# Patient Record
Sex: Male | Born: 2000 | Race: Black or African American | Hispanic: No | Marital: Single | Smoking: Current some day smoker
Health system: Southern US, Community
[De-identification: ages and names within clinical notes are randomized; demographics above are authoritative.]

## PROBLEM LIST (undated history)

## (undated) DIAGNOSIS — L309 Dermatitis, unspecified: Secondary | ICD-10-CM

## (undated) DIAGNOSIS — T7840XA Allergy, unspecified, initial encounter: Secondary | ICD-10-CM

## (undated) DIAGNOSIS — J302 Other seasonal allergic rhinitis: Secondary | ICD-10-CM

## (undated) HISTORY — DX: Dermatitis, unspecified: L30.9

## (undated) HISTORY — DX: Allergy, unspecified, initial encounter: T78.40XA

---
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
## 2018-06-03 MED ORDER — PROPOFOL 10 MG/ML IV BOLUS
INTRAVENOUS | Status: AC
  Filled 2018-06-03: qty 20

## 2018-06-03 MED ORDER — MIDAZOLAM HCL 2 MG/2ML IJ SOLN
INTRAMUSCULAR | Status: AC
  Filled 2018-06-03: qty 2

## 2018-06-03 MED ORDER — LIDOCAINE 2% (20 MG/ML) 5 ML SYRINGE
INTRAMUSCULAR | Status: AC
  Filled 2018-06-03: qty 5

## 2018-06-03 MED ORDER — ONDANSETRON HCL 4 MG/2ML IJ SOLN
INTRAMUSCULAR | Status: AC
Start: 2018-06-03 — End: ?
  Filled 2018-06-03: qty 2

## 2018-06-03 MED ORDER — DEXAMETHASONE SODIUM PHOSPHATE 10 MG/ML IJ SOLN
INTRAMUSCULAR | Status: AC
  Filled 2018-06-03: qty 1

## 2018-06-03 MED ORDER — SUCCINYLCHOLINE CHLORIDE 200 MG/10ML IV SOSY
PREFILLED_SYRINGE | INTRAVENOUS | Status: AC
  Filled 2018-06-03: qty 10

## 2018-06-03 MED ORDER — FENTANYL CITRATE (PF) 250 MCG/5ML IJ SOLN
INTRAMUSCULAR | Status: AC
  Filled 2018-06-03: qty 5

## 2018-06-04 ENCOUNTER — Emergency Department (HOSPITAL_BASED_OUTPATIENT_CLINIC_OR_DEPARTMENT_OTHER)
Admission: EM | Admit: 2018-06-04 | Discharge: 2018-06-04 | Disposition: A | Discharge status: Home / Self Care | Payer: 59 | Attending: Urology | Admitting: Urology

## 2018-06-04 ENCOUNTER — Emergency Department (HOSPITAL_COMMUNITY): Payer: 59 | Admitting: Registered Nurse

## 2018-06-04 ENCOUNTER — Encounter (HOSPITAL_BASED_OUTPATIENT_CLINIC_OR_DEPARTMENT_OTHER): Payer: Self-pay | Admitting: Emergency Medicine

## 2018-06-04 ENCOUNTER — Encounter (HOSPITAL_COMMUNITY)
Admission: EM | Disposition: A | Discharge status: Home / Self Care | Payer: Self-pay | Source: Home / Self Care | Attending: Emergency Medicine

## 2018-06-04 ENCOUNTER — Other Ambulatory Visit: Payer: Self-pay

## 2018-06-04 ENCOUNTER — Emergency Department (HOSPITAL_COMMUNITY): Payer: 59

## 2018-06-04 DIAGNOSIS — Z91010 Allergy to peanuts: Secondary | ICD-10-CM | POA: Insufficient documentation

## 2018-06-04 DIAGNOSIS — J45909 Unspecified asthma, uncomplicated: Secondary | ICD-10-CM | POA: Insufficient documentation

## 2018-06-04 DIAGNOSIS — Z7951 Long term (current) use of inhaled steroids: Secondary | ICD-10-CM | POA: Insufficient documentation

## 2018-06-04 DIAGNOSIS — Z886 Allergy status to analgesic agent status: Secondary | ICD-10-CM | POA: Insufficient documentation

## 2018-06-04 DIAGNOSIS — N4400 Torsion of testis, unspecified: Secondary | ICD-10-CM | POA: Insufficient documentation

## 2018-06-04 DIAGNOSIS — N50811 Right testicular pain: Secondary | ICD-10-CM | POA: Diagnosis present

## 2018-06-04 DIAGNOSIS — Z91018 Allergy to other foods: Secondary | ICD-10-CM | POA: Diagnosis not present

## 2018-06-04 DIAGNOSIS — Z91012 Allergy to eggs: Secondary | ICD-10-CM | POA: Diagnosis not present

## 2018-06-04 DIAGNOSIS — Z79899 Other long term (current) drug therapy: Secondary | ICD-10-CM | POA: Diagnosis not present

## 2018-06-04 HISTORY — PX: ORCHIECTOMY: SHX2116

## 2018-06-04 SURGERY — ORCHIECTOMY
Anesthesia: General | Site: Scrotum | Laterality: Right

## 2018-06-04 MED ORDER — ONDANSETRON HCL 4 MG/2ML IJ SOLN: 4.0000 mg | Freq: Once | INTRAMUSCULAR | Status: DC | PRN

## 2018-06-04 MED ORDER — HYDROCODONE-ACETAMINOPHEN 5-325 MG PO TABS: 1.0000 | ORAL_TABLET | ORAL | 0 refills | Status: DC | PRN

## 2018-06-04 MED ORDER — LACTATED RINGERS IV SOLN
INTRAVENOUS | Status: DC | PRN
  Administered 2018-06-04: 02:00:00 via INTRAVENOUS

## 2018-06-04 MED ORDER — HYDROMORPHONE HCL 1 MG/ML IJ SOLN
1.0000 mg | Freq: Once | INTRAMUSCULAR | Status: AC
  Administered 2018-06-04: 1 mg via INTRAVENOUS
  Filled 2018-06-04: qty 1

## 2018-06-04 MED ORDER — CEFAZOLIN SODIUM-DEXTROSE 2-3 GM-%(50ML) IV SOLR
INTRAVENOUS | Status: DC | PRN
  Administered 2018-06-04: 2 g via INTRAVENOUS

## 2018-06-04 MED ORDER — BUPIVACAINE HCL (PF) 0.25 % IJ SOLN
INTRAMUSCULAR | Status: DC | PRN
  Administered 2018-06-04: 10 mL

## 2018-06-04 MED ORDER — DEXAMETHASONE SODIUM PHOSPHATE 10 MG/ML IJ SOLN
INTRAMUSCULAR | Status: DC | PRN
  Administered 2018-06-04: 10 mg via INTRAVENOUS

## 2018-06-04 MED ORDER — CEFAZOLIN SODIUM-DEXTROSE 2-4 GM/100ML-% IV SOLN
INTRAVENOUS | Status: AC
  Filled 2018-06-04: qty 100

## 2018-06-04 MED ORDER — MORPHINE SULFATE (PF) 4 MG/ML IV SOLN
4.0000 mg | Freq: Once | INTRAVENOUS | Status: AC
  Administered 2018-06-04: 4 mg via INTRAVENOUS
  Filled 2018-06-04: qty 1

## 2018-06-04 MED ORDER — ONDANSETRON HCL 4 MG/2ML IJ SOLN
INTRAMUSCULAR | Status: DC | PRN
  Administered 2018-06-04: 4 mg via INTRAVENOUS

## 2018-06-04 MED ORDER — FENTANYL CITRATE (PF) 250 MCG/5ML IJ SOLN
INTRAMUSCULAR | Status: DC | PRN
  Administered 2018-06-04 (×2): 50 ug via INTRAVENOUS
  Administered 2018-06-04: 100 ug via INTRAVENOUS

## 2018-06-04 MED ORDER — PROPOFOL 10 MG/ML IV BOLUS
INTRAVENOUS | Status: DC | PRN
  Administered 2018-06-04: 200 mg via INTRAVENOUS

## 2018-06-04 MED ORDER — MIDAZOLAM HCL 5 MG/5ML IJ SOLN
INTRAMUSCULAR | Status: DC | PRN
  Administered 2018-06-04: 2 mg via INTRAVENOUS

## 2018-06-04 MED ORDER — 0.9 % SODIUM CHLORIDE (POUR BTL) OPTIME
TOPICAL | Status: DC | PRN
  Administered 2018-06-04: 1000 mL

## 2018-06-04 MED ORDER — FENTANYL CITRATE (PF) 100 MCG/2ML IJ SOLN: 25.0000 ug | INTRAMUSCULAR | Status: DC | PRN

## 2018-06-04 MED ORDER — ONDANSETRON HCL 4 MG/2ML IJ SOLN
4.0000 mg | Freq: Once | INTRAMUSCULAR | Status: AC
  Administered 2018-06-04: 4 mg via INTRAVENOUS
  Filled 2018-06-04: qty 2

## 2018-06-04 MED ORDER — BUPIVACAINE HCL (PF) 0.25 % IJ SOLN
INTRAMUSCULAR | Status: AC
  Filled 2018-06-04: qty 30

## 2018-06-04 MED ORDER — SUCCINYLCHOLINE CHLORIDE 200 MG/10ML IV SOSY
PREFILLED_SYRINGE | INTRAVENOUS | Status: DC | PRN
  Administered 2018-06-04: 120 mg via INTRAVENOUS

## 2018-06-04 MED ORDER — LIDOCAINE 2% (20 MG/ML) 5 ML SYRINGE
INTRAMUSCULAR | Status: DC | PRN
  Administered 2018-06-04: 80 mg via INTRAVENOUS

## 2018-06-04 SURGICAL SUPPLY — 25 items
BLADE CLIPPER SURG (BLADE) ×3 IMPLANT
BLADE HEX COATED 2.75 (ELECTRODE) IMPLANT
BNDG GAUZE ELAST 4 BULKY (GAUZE/BANDAGES/DRESSINGS) IMPLANT
COVER SURGICAL LIGHT HANDLE (MISCELLANEOUS) ×3 IMPLANT
DERMABOND ADVANCED (GAUZE/BANDAGES/DRESSINGS) ×4
DERMABOND ADVANCED .7 DNX12 (GAUZE/BANDAGES/DRESSINGS) ×2 IMPLANT
DRAIN PENROSE 18X1/4 LTX STRL (WOUND CARE) IMPLANT
DRAPE LAPAROTOMY T 98X78 PEDS (DRAPES) ×3 IMPLANT
ELECT REM PT RETURN 15FT ADLT (MISCELLANEOUS) ×3 IMPLANT
GLOVE BIO SURGEON STRL SZ7.5 (GLOVE) ×3 IMPLANT
GOWN STRL REUS W/TWL LRG LVL3 (GOWN DISPOSABLE) ×3 IMPLANT
KIT BASIN OR (CUSTOM PROCEDURE TRAY) ×3 IMPLANT
NEEDLE HYPO 22GX1.5 SAFETY (NEEDLE) ×3 IMPLANT
NS IRRIG 1000ML POUR BTL (IV SOLUTION) ×3 IMPLANT
PACK GENERAL/GYN (CUSTOM PROCEDURE TRAY) ×3 IMPLANT
SOL PREP PROV IODINE SCRUB 4OZ (MISCELLANEOUS) ×3 IMPLANT
SUPPORT SCROTAL LG STRP (MISCELLANEOUS) ×2 IMPLANT
SUPPORTER ATHLETIC LG (MISCELLANEOUS) ×1
SUT CHROMIC 3 0 SH 27 (SUTURE) ×6 IMPLANT
SUT PDS AB 4-0 RB1 27 (SUTURE) ×3 IMPLANT
SUT VIC AB 2-0 UR5 27 (SUTURE) IMPLANT
SUT VICRYL 0 TIES 12 18 (SUTURE) IMPLANT
SYR CONTROL 10ML LL (SYRINGE) IMPLANT
TOWEL OR 17X26 10 PK STRL BLUE (TOWEL DISPOSABLE) ×3 IMPLANT
WATER STERILE IRR 1000ML POUR (IV SOLUTION) ×3 IMPLANT

## 2018-06-04 NOTE — H&P (Signed)
H&P Physician requesting consult: Jerry LibraJohn Molpus, MD  Chief Complaint: Right testicular torsion  History of Present Illness: 17 year old male presents with a 2 to 3-hour history of acute onset right testicular pain.  [facility]  Bedside ultrasound revealed confirmation of right testicular torsion with no flow to the right testicle.  Continues to have some right-sided testicular pain.  Otherwise, no complaints.  No hematuria dysuria.  Past Medical History:  Diagnosis Date  . Allergy   . Asthma   . Eczema   . Seasonal allergies    History reviewed. No pertinent surgical history.  Home Medications:   (Not in a hospital admission) Allergies:  Allergies  Allergen Reactions  . Aspirin Shortness Of Breath    Feels like his chest "closes up"   . Corn-Containing Products   . Eggs Or Egg-Derived Products   . Peanut-Containing Drug Products     Family History  Problem Relation Age of Onset  . Kidney disease Maternal Grandfather 23       kidney failure  . Cancer Paternal Grandmother [id number]       b reast cancer  . Heart disease Neg Hx   . Stroke Neg Hx    Social History:  reports that he has never smoked. He has never used smokeless tobacco. He reports that he does not drink alcohol or use drugs.  ROS: A complete review of systems was performed.  All systems are negative except for pertinent findings as noted. ROS   Physical Exam:  Vital signs in last 24 hours: Temp:  [98.5 F (36.9 C)] 98.5 F (36.9 C) (08/28 0030) Pulse Rate:  [68] 68 (08/28 0030) Resp:  [18] 18 (08/28 0030) BP: (149)/(71) 149/71 (08/28 0030) SpO2:  [99 %] 99 % (08/28 0201) Weight:  [95.3 kg] 95.3 kg (08/28 0032) General:  Alert and oriented, No acute distress HEENT: Normocephalic, atraumatic Neck: No JVD or lymphadenopathy Cardiovascular: Regular rate and rhythm Lungs: Regular rate and effort Abdomen: Soft, nontender, nondistended, no abdominal masses Genitourinary:  Circumcised phallus.  Left testicle normal.  Right testicle high riding and firm.  Tenderness to palpation. Back: No CVA tenderness Extremities: No edema Neurologic: Grossly intact  Laboratory Data:  No results found for this or any previous visit (from the past 24 hour(s)). No results found for this or any previous visit (from the past 240 hour(s)). Creatinine: No results for input(s): CREATININE in the last 168 hours.  Imaging: Scrotal ultrasound personally reviewed.  No obvious flow to the right testicle.  Left testicle appears normal.  Impression/Assessment:  Right testicular torsion  Plan:  Proceed emergently to the operating room for bilateral orchiopexy, possible right orchiectomy.  Full informed consent was obtained.  Risks including but not limited to bleeding including hematoma formation, infection, injury to surrounding structures, need for additional procedures were described in detail.  Jerry Diaz, III 06/04/2018, 2:13 AM

## 2018-06-04 NOTE — ED Provider Notes (Signed)
The patient was seen at [facility] after the acute onset of right testicular pain about 10:30 PM yesterday evening.  Out of concern for acute torsion he was sent here for an emergent scrotal ultrasound.  Dr. Alvester MorinBell of urology was consulted as well.  The patient states he is still in significant pain.'  On exam the patient's right testicle is withdrawn and firm with significant tenderness.  He is circumcised with no urethral discharge seen.  The patient's left testicle is normal.  1:48 AM Scrotal ultrasound shows no flow to right testicle.  Dr. Alvester MorinBell of urology consulted and will take the patient to the operating room.  [place] Scrotum W/doppler  Result Date: 06/04/2018 CLINICAL DATA:  17 year old male with right-sided testicular pain x2 hours. EXAM: SCROTAL ULTRASOUND DOPPLER ULTRASOUND OF THE TESTICLES TECHNIQUE: Complete ultrasound examination of the testicles, epididymis, and other scrotal structures was performed. Color and spectral Doppler ultrasound were also utilized to evaluate blood flow to the testicles. COMPARISON:  None. FINDINGS: Right testicle Measurements: 4.9 x 3.2 x 2.8 cm. The right testicle is mildly rounded and hypoechoic in keeping with edema. No focal testicular lesion. Left testicle Measurements: 4.6 x 2.1 x 2.9 cm. No mass or microlithiasis visualized. Right epididymis:  Normal in size and appearance. Left epididymis:  Normal in size and appearance. Hydrocele:  Trace right sided fluid, likely reactive. Varicocele:  None visualized. Pulsed Doppler interrogation of both testes demonstrates absence of flow to the right testicle with normal low resistance arterial and venous waveforms to the left testicle. 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 Clinical correlation and surgical consult is advised. These results were called by telephone at the time of interpretation on 06/04/2018 at 1:53 am to Dr. Read DriversMolpus, who verbally acknowledged these results.  Electronically Signed   By: Elgie CollardArash  Radparvar M.D.   On: 06/04/2018 01:54      Charm Stenner, Jonny RuizJohn, MD 06/04/18 [phone]

## 2018-06-04 NOTE — ED Triage Notes (Signed)
Pt c/o 10/10 testicle pain and swollen for the past 2 hours. Pt denies any injury.

## 2018-06-04 NOTE — ED Notes (Signed)
Bedside ultrasound-Dr. Molpus called to bedside/Dr. Alvester MorinBell urology paged and patient being prepared for OR

## 2018-06-04 NOTE — Op Note (Signed)
Operative Note  Preoperative diagnosis:  1.  Right testicular torsion  Postoperative diagnosis: 1.  Right testicular torsion  Procedure(s): 1.  Scrotal exploration with bilateral orchiopexy 2.  Bilateral ablation of appendix testis  Surgeon: Modena SlaterEugene Bell, MD  Assistants: None  Anesthesia: General  Complications: None immediate  EBL: 10 cc  Specimens: 1.  None  Drains/Catheters: 1.  None  Intraoperative findings: Right testicular torsion  Indication: 17 year old male presented with a 2 to 3-hour history of right-sided severe testicular pain.  Ultrasound confirmed torsion.  Description of procedure:  The patient was identified and consent was obtained.  The patient was taken to the operating room and placed in the supine position.  The patient was placed under general anesthesia.  Perioperative antibiotics were administered. Patient was prepped and draped in a standard sterile fashion and a timeout was performed.  A 3 cm midline scrotal incision was made along the median raphae.  This was carried down sharply through the dark toes on top of the right testicle which was then delivered onto the operative field.  The testicle was obviously torsed and I detorsed it.  I wrapped it in a wet Ray-Tec.  I then turned my attention to the other side and sharply came through the dark toes on the left and delivered the left testicle.  Bilateral appendix testes were ablated with electrocautery.  The left testicle is normal.  I then used 4-0 PDS suture to perform a two-point fixation superiorly and inferiorly and secured the left testicle to the midline portion of the dartos.  I delivered the testicle in its proper position in the scrotum and secured down the stitches.  I then inspected the right testicle.  This had pinked up quite nicely.  It appeared viable.  I therefore again on this side performed a two-point fixation superiorly and inferiorly with 4-0 PDS suture.  I secured this to the midline  dartos and delivered the testicle in its proper anatomical position back into the scrotum followed by securing down the stitches.  I then reapproximated the dartos after confirming hemostasis.  I reapproximated it with running 3-0 chromic.  I then closed the skin with running 3-0 chromic.  I applied Dermabond.  Also instilled quarter percent Marcaine for anesthetic effect.  This could the operation.  The patient tolerated procedure well and was stable postoperatively.  Plan: Return in about 4 weeks for postoperative check.

## 2018-06-04 NOTE — ED Notes (Signed)
Patient to OR via stretcher.

## 2018-06-04 NOTE — Anesthesia Procedure Notes (Addendum)
Procedure Name: Intubation Date/Time: 06/04/2018 2:37 AM Performed by: Talbot Grumbling, CRNA Pre-anesthesia Checklist: Patient identified, Emergency Drugs available, Suction available and Patient being monitored Patient Re-evaluated:Patient Re-evaluated prior to induction Oxygen Delivery Method: Circle system utilized Preoxygenation: Pre-oxygenation with 100% oxygen Induction Type: IV induction and Rapid sequence Laryngoscope Size: Mac and 4 Grade View: Grade I Tube size: 7.5 mm Number of attempts: 1 Airway Equipment and Method: Stylet Placement Confirmation: ETT inserted through vocal cords under direct vision,  positive ETCO2 and breath sounds checked- equal and bilateral Secured at: 23 cm Tube secured with: Tape Dental Injury: Teeth and Oropharynx as per pre-operative assessment

## 2018-06-04 NOTE — ED Provider Notes (Signed)
[facility] EMERGENCY DEPARTMENT Provider Note   CSN: [id number] Arrival date & time: 06/04/18  0022     History   Chief Complaint Chief Complaint  Patient presents with  . Testicle Pain    HPI Jerry Diaz is a 17 y.o. male.  HPI  This is an 17 year old male who presents with right testicle pain.  Acute onset of right testicle pain 1 prior hour prior to arrival.  Reports that he was at work prior to that with no symptoms.  Never had any symptoms like this before.  Denies any penile discharge or dysuria.  Rates his pain at 10 out of 10.  He has not taken anything for the pain.  He feels that his testicle is swollen and hard.  Reports nausea without vomiting.  Past Medical History:  Diagnosis Date  . Allergy   . Asthma   . Eczema   . Seasonal allergies     Patient Active Problem List   Diagnosis Date Noted  . Boil 11/14/2016  . 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 EGG ALLERGY 02/02/2009  . ECZEMA, ATOPIC DERMATITIS 12/05/2006    History reviewed. No pertinent surgical history.      Home Medications    Prior to Admission medications   Medication Sig Start Date End Date Taking? Authorizing Provider  albuterol (PROVENTIL HFA;VENTOLIN HFA) 108 (90 Base) MCG/ACT inhaler Inhale 2 puffs into the lungs every 6 (six) hours as needed for wheezing or shortness of breath. 01/29/17   Hollice GongSawyer, Tarshree, MD  beclomethasone (QVAR) 40 MCG/ACT inhaler Inhale 2 puffs into the lungs 2 (two) times daily. 06/10/13   Hayden RasmussenMabe, David, NP  cetirizine (ZYRTEC ALLERGY) 10 MG tablet Take 1 tablet (10 mg total) by mouth daily. 01/29/17   Hollice GongSawyer, Tarshree, MD  EPINEPHrine (EPIPEN) 0.3 mg/0.3 mL DEVI Inject 0.3 mLs (0.3 mg total) into the muscle once. 06/01/11   Macy MisBriscoe, Kim K, MD  fexofenadine (ALLEGRA) 180 MG tablet Take 1 tablet (180 mg total) by mouth daily. 06/10/13   Hayden RasmussenMabe, David, NP  fluticasone (FLONASE) 50 MCG/ACT nasal spray Place 2 sprays into the nose daily. 06/10/13   Hayden RasmussenMabe, David, NP    fluticasone (FLOVENT HFA) 44 MCG/ACT inhaler Inhale 2 puffs into the lungs 2 (two) times daily. 01/29/17   Hollice GongSawyer, Tarshree, MD  ipratropium (ATROVENT) 0.06 % nasal spray Place 1 spray into the nose 4 (four) times daily. 01/12/13   Linna HoffKindl, James D, MD  hydrOXYzine (ATARAX) 10 MG tablet One at bedtime and as needed, not more than 3 per day   11/07/11  [provider]  tacrolimus (PROTOPIC) 0.03 % ointment 1 (Rx by Dr. Willa RoughHicks)   11/07/11  [provider]    Family History Family History  Problem Relation Age of Onset  . Kidney disease Maternal Grandfather 23       kidney failure  . Cancer Paternal Grandmother 4048       b reast cancer  . Heart disease Neg Hx   . Stroke Neg Hx     Social History Social History   Tobacco Use  . Smoking status: Never Smoker  . Smokeless tobacco: Never Used  Substance Use Topics  . Alcohol use: No  . Drug use: No     Allergies   Aspirin; Corn-containing products; Eggs or egg-derived products; and Peanut-containing drug products   Review of Systems Review of Systems  Constitutional: Negative for fever.  Gastrointestinal: Positive for nausea. Negative for vomiting.  Genitourinary: Positive for scrotal swelling  and testicular pain. Negative for discharge and dysuria.  All other systems reviewed and are negative.    Physical Exam Updated Vital Signs BP (!) 149/71 (BP Location: Right Arm)   Pulse 68   Temp 98.5 F (36.9 C) (Oral)   Resp 18   Ht 6' (1.829 m)   Wt 95.3 kg   SpO2 99%   BMI 28.49 kg/m   Physical Exam  Constitutional: He is oriented to person, place, and time. He appears well-developed and well-nourished.  Obese, very uncomfortable appearing  HENT:  Head: Normocephalic and atraumatic.  Cardiovascular: Normal rate, regular rhythm and normal heart sounds.  No murmur heard. Pulmonary/Chest: Effort normal and breath sounds normal. No respiratory distress.  Abdominal: Soft. Bowel sounds are normal. There is no  tenderness. There is no rebound.  Genitourinary:  Genitourinary Comments: High riding right testicle with tenderness to palpation, testicle hard and edematous, no crepitus, no overlying skin changes, absent cremasteric reflex  Musculoskeletal: He exhibits no edema.  Neurological: He is alert and oriented to person, place, and time.  Skin: Skin is warm and dry.  Psychiatric: He has a normal mood and affect.  Nursing note and vitals reviewed.    ED Treatments / Results  Labs (all labs ordered are listed, but only abnormal results are displayed) Labs Reviewed - No data to display  EKG None  Radiology No results found.  Procedures Procedures (including critical care time)  Medications Ordered in ED Medications  morphine 4 MG/ML injection 4 mg (4 mg Intravenous Given 06/04/18 0045)  ondansetron (ZOFRAN) injection 4 mg (4 mg Intravenous Given 06/04/18 0045)     Initial Impression / Assessment and Plan / ED Course  I have reviewed the triage vital signs and the nursing notes.  Pertinent labs & imaging results that were available during my care of the patient were reviewed by me and considered in my medical decision making (see chart for details).     Patient presents with acute onset of right testicle pain.  He appears very uncomfortable although his vital signs are reassuring.  No significant medical problems.  Clinically have a very high suspicion for torsion.  Patient was given pain and nausea medication.  Was unable to detorse manually.  Patient has been in pain for approximately 90 minutes at this point.  Discussed with my colleague, Dr. Read Drivers at the ED.  We will plan for an ED to ED transfer by private vehicle (aunt driving) for ultrasound and urology evaluation.  I did discuss the patient with Dr. Alvester Morin, urology.  He is aware of his acute presentation and will be informed of his arrival was along.  Ultrasound is ordered.    Final Clinical Impressions(s) / ED Diagnoses    Final diagnoses:  Pain in right testicle    ED Discharge Orders    None       Jaynia Fendley, Mayer Masker, MD 06/04/18 [phone]

## 2018-06-04 NOTE — Transfer of Care (Signed)
Immediate Anesthesia Transfer of Care Note  Patient: Jerry Diaz  Procedure(s) Performed: bilateral orciopexy,scrotal exploration (Right Scrotum)  Patient Location: PACU  Anesthesia Type:General  Level of Consciousness: sedated  Airway & Oxygen Therapy: Patient Spontanous Breathing and Patient connected to face mask oxygen  Post-op Assessment: Report given to RN and Post -op Vital signs reviewed and stable  Post vital signs: Reviewed and stable  Last Vitals:  Vitals Value Taken Time  BP 131/60 06/04/2018  3:20 AM  Temp 37.1 C 06/04/2018  3:20 AM  Pulse 88 06/04/2018  3:29 AM  Resp 18 06/04/2018  3:29 AM  SpO2 100 % 06/04/2018  3:29 AM  Vitals shown include unvalidated device data.  Last Pain:  Vitals:   06/04/18 0320  TempSrc:   PainSc: Asleep         Complications: No apparent anesthesia complications

## 2018-06-04 NOTE — Anesthesia Preprocedure Evaluation (Signed)
Anesthesia Evaluation  Patient identified by MRN, date of birth, ID band  Reviewed: Allergy & Precautions, NPO status , Patient's Chart, lab work & pertinent test resultsPreop documentation limited or incomplete due to emergent nature of procedure.  Airway Mallampati: II  TM Distance: >3 FB Neck ROM: Full    Dental  (+) Dental Advisory Given   Pulmonary asthma ,    breath sounds clear to auscultation       Cardiovascular negative cardio ROS   Rhythm:Regular Rate:Normal     Neuro/Psych negative neurological ROS     GI/Hepatic negative GI ROS, Neg liver ROS,   Endo/Other    Renal/GU negative Renal ROS   Testicular torsion    Musculoskeletal   Abdominal   Peds  Hematology negative hematology ROS (+)   Anesthesia Other Findings   Reproductive/Obstetrics                            No results found for: WBC, HGB, HCT, MCV, PLT No results found for: CREATININE, BUN, NA, K, CL, CO2  Anesthesia Physical Anesthesia Plan  ASA: II and emergent  Anesthesia Plan: General   Post-op Pain Management:    Induction: Intravenous and Rapid sequence  PONV Risk Score and Plan: 2 and Ondansetron, Dexamethasone and Treatment may vary due to age or medical condition  Airway Management Planned: Oral ETT  Additional Equipment:   Intra-op Plan:   Post-operative Plan: Extubation in OR  Informed Consent: I have reviewed the patients History and Physical, chart, labs and discussed the procedure including the risks, benefits and alternatives for the proposed anesthesia with the patient or authorized representative who has indicated his/her understanding and acceptance.   Dental advisory given  Plan Discussed with: CRNA  Anesthesia Plan Comments:         Anesthesia Quick Evaluation

## 2018-06-05 ENCOUNTER — Encounter (HOSPITAL_COMMUNITY): Payer: Self-pay | Admitting: Urology

## 2018-06-05 NOTE — Anesthesia Postprocedure Evaluation (Signed)
Anesthesia Post Note  Patient: Jerry Diaz  Procedure(s) Performed: bilateral orciopexy,scrotal exploration (Right Scrotum)     Patient location during evaluation: PACU Anesthesia Type: General Level of consciousness: awake and alert Pain management: pain level controlled Vital Signs Assessment: post-procedure vital signs reviewed and stable Respiratory status: spontaneous breathing, nonlabored ventilation, respiratory function stable and patient connected to nasal cannula oxygen Cardiovascular status: blood pressure returned to baseline and stable Postop Assessment: no apparent nausea or vomiting Anesthetic complications: no    Last Vitals:  Vitals:   06/04/18 0345 06/04/18 0400  BP: 131/63 138/86  Pulse: 87 92  Resp: 17 17  Temp:  37.3 C  SpO2: 96% 96%    Last Pain:  Vitals:   06/04/18 0400  TempSrc:   PainSc: 0-No pain                 Kennieth RadFitzgerald, Alexzia Kasler E

## 2018-09-08 ENCOUNTER — Encounter (HOSPITAL_COMMUNITY): Payer: Self-pay | Admitting: Emergency Medicine

## 2018-09-08 ENCOUNTER — Ambulatory Visit (HOSPITAL_COMMUNITY)
Admission: EM | Admit: 2018-09-08 | Discharge: 2018-09-08 | Disposition: A | Discharge status: Home / Self Care | Payer: 59 | Attending: Family Medicine | Admitting: Family Medicine

## 2018-09-08 DIAGNOSIS — R21 Rash and other nonspecific skin eruption: Secondary | ICD-10-CM

## 2018-09-08 MED ORDER — HYDROXYZINE HCL 25 MG PO TABS: 25.0000 mg | ORAL_TABLET | Freq: Four times a day (QID) | ORAL | 0 refills | Status: AC

## 2018-09-08 MED ORDER — PREDNISONE 10 MG (21) PO TBPK: ORAL_TABLET | Freq: Every day | ORAL | 0 refills | Status: DC

## 2018-09-08 MED ORDER — TRIAMCINOLONE ACETONIDE 0.1 % EX CREA: 1.0000 "application " | TOPICAL_CREAM | Freq: Two times a day (BID) | CUTANEOUS | 0 refills | Status: DC

## 2018-09-08 MED ORDER — KETOCONAZOLE 2 % EX SHAM: 1.0000 "application " | MEDICATED_SHAMPOO | CUTANEOUS | 0 refills | Status: AC

## 2018-09-08 NOTE — ED Triage Notes (Signed)
Pt here for rash to neck and face that is likely eczema flare up

## 2018-09-08 NOTE — ED Provider Notes (Signed)
[facility]    CSN: [id number] Arrival date & time: 09/08/18  6962     History   Chief Complaint Chief Complaint  Patient presents with  . Rash    HPI Jerry Diaz is a 17 y.o. male.   17 year old male comes in for 2 month history of rash. Started around his mouth, and spread to the eyelid/face and neck. Rash is itching in nature, and has continued to scratch. Denies spreading erythema, warmth, pain. Denies fever, chills, night sweats. No new hygiene product use or obvious new exposures. Has history of eczema. Has been applying Vaseline to the area.       Past Medical History:  Diagnosis Date  . Allergy   . Asthma   . Eczema   . Seasonal allergies     Patient Active Problem List   Diagnosis Date Noted  . Boil 11/14/2016  . 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 EGG ALLERGY 02/02/2009  . ECZEMA, ATOPIC DERMATITIS 12/05/2006    Past Surgical History:  Procedure Laterality Date  . ORCHIECTOMY Right 06/04/2018   Procedure: bilateral orciopexy,scrotal exploration;  Surgeon: Crista Elliot, MD;  Location: [facility];  Service: Urology;  Laterality: Right;       Home Medications    Prior to Admission medications   Medication Sig Start Date End Date Taking? Authorizing Provider  albuterol (PROVENTIL HFA;VENTOLIN HFA) 108 (90 Base) MCG/ACT inhaler Inhale 2 puffs into the lungs every 6 (six) hours as needed for wheezing or shortness of breath. 01/29/17   Hollice Gong, MD  beclomethasone (QVAR) 40 MCG/ACT inhaler Inhale 2 puffs into the lungs 2 (two) times daily. 06/10/13   Hayden Rasmussen, NP  cetirizine (ZYRTEC ALLERGY) 10 MG tablet Take 1 tablet (10 mg total) by mouth daily. 01/29/17   Hollice Gong, MD  EPINEPHrine (EPIPEN) 0.3 mg/0.3 mL DEVI Inject 0.3 mLs (0.3 mg total) into the muscle once. 06/01/11   Macy Mis, MD  fexofenadine (ALLEGRA) 180 MG tablet Take 1 tablet (180 mg total) by mouth daily. 06/10/13   Hayden Rasmussen, NP  fluticasone (FLONASE) 50 MCG/ACT  nasal spray Place 2 sprays into the nose daily. 06/10/13   Hayden Rasmussen, NP  fluticasone (FLOVENT HFA) 44 MCG/ACT inhaler Inhale 2 puffs into the lungs 2 (two) times daily. 01/29/17   Hollice Gong, MD  hydrOXYzine (ATARAX/VISTARIL) 25 MG tablet Take 1 tablet (25 mg total) by mouth every 6 (six) hours for 5 days. 09/08/18 09/13/18  Belinda Fisher, PA-C  ketoconazole (NIZORAL) 2 % shampoo Apply 1 application topically 2 (two) times a week for 7 days. 09/08/18 09/15/18  Cathie Hoops,  V, PA-C  predniSONE (STERAPRED UNI-PAK 21 TAB) 10 MG (21) TBPK tablet Take by mouth daily. Take 6 tabs by mouth day 1, then 5 tabs, then 4 tabs, then 3 tabs, 2 tabs, then 1 tab for the last day 09/08/18   Cathie Hoops,  V, PA-C  triamcinolone cream (KENALOG) 0.1 % Apply 1 application topically 2 (two) times daily. 09/08/18   Belinda Fisher, PA-C    Family History Family History  Problem Relation Age of Onset  . Kidney disease Maternal Grandfather 23       kidney failure  . Cancer Paternal Grandmother 95       b reast cancer  . Heart disease Neg Hx   . Stroke Neg Hx     Social History Social History   Tobacco Use  . Smoking status: Never Smoker  . Smokeless tobacco:  Never Used  Substance Use Topics  . Alcohol use: No  . Drug use: No     Allergies   Aspirin; Corn-containing products; Eggs or egg-derived products; and Peanut-containing drug products   Review of Systems Review of Systems  Reason unable to perform ROS: See HPI as above.     Physical Exam Triage Vital Signs ED Triage Vitals [09/08/18 1050]  Enc Vitals Group     BP 135/76     Pulse Rate 88     Resp 18     Temp 98.3 F (36.8 C)     Temp Source Oral     SpO2 100 %     Weight      Height      Head Circumference      Peak Flow      Pain Score 2     Pain Loc      Pain Edu?      Excl. in GC?    No data found.  Updated Vital Signs BP 135/76 (BP Location: Right Arm)   Pulse 88   Temp 98.3 F (36.8 C) (Oral)   Resp 18   SpO2 100%   Physical Exam    Constitutional: He is oriented to person, place, and time. He appears well-developed and well-nourished. No distress.  HENT:  Head: Normocephalic and atraumatic.  Eyes: Pupils are equal, round, and reactive to light. Conjunctivae are normal.  Neurological: He is alert and oriented to person, place, and time.  Skin: He is not diaphoretic.  See picture below. Dry scaling rash with thickening of skin. Small abrasions throughout with scratch marks. No warmth, no tenderness to palpation.            UC Treatments / Results  Labs (all labs ordered are listed, but only abnormal results are displayed) Labs Reviewed - No data to display  EKG None  Radiology No results found.  Procedures Procedures (including critical care time)  Medications Ordered in UC Medications - No data to display  Initial Impression / Assessment and Plan / UC Course  I have reviewed the triage vital signs and the nursing notes.  Pertinent labs & imaging results that were available during my care of the patient were reviewed by me and considered in my medical decision making (see chart for details).    Case discussed with Dr Delton SeeNelson. Will start prednisone and hydroxyzine. Discussed possibility of seborrheic dermatitis, start ketoconazle as directed. Return precautions given.  Final Clinical Impressions(s) / UC Diagnoses   Final diagnoses:  Rash    ED Prescriptions    Medication Sig Dispense Auth. Provider   predniSONE (STERAPRED UNI-PAK 21 TAB) 10 MG (21) TBPK tablet  (Status: Discontinued) Take by mouth daily. Take 6 tabs by mouth day 1, then 5 tabs, then 4 tabs, then 3 tabs, 2 tabs, then 1 tab for the last day 21 tablet ,  V, PA-C   ketoconazole (NIZORAL) 2 % shampoo Apply 1 application topically 2 (two) times a week for 7 days. 10 mL ,  V, PA-C   hydrOXYzine (ATARAX/VISTARIL) 25 MG tablet Take 1 tablet (25 mg total) by mouth every 6 (six) hours for 5 days. 20 tablet ,  V, PA-C    predniSONE (STERAPRED UNI-PAK 21 TAB) 10 MG (21) TBPK tablet Take by mouth daily. Take 6 tabs by mouth day 1, then 5 tabs, then 4 tabs, then 3 tabs, 2 tabs, then 1 tab for the last day 21 tablet Cathie Hoops,  V,  PA-C   triamcinolone cream (KENALOG) 0.1 % Apply 1 application topically 2 (two) times daily. 30 g Threasa Alpha, [place] 09/08/18 1133

## 2018-09-08 NOTE — Discharge Instructions (Addendum)
Start prednisone as directed. You can use the shampoo for possible seborrheic dermatitis. Hydroxyzine as directed for itching. You can apply triamcinolone to the neck area to help with symptoms, avoid on face. Avoid itching, you can use ice compress. Follow up with PCP/dermatology if symptoms not improving.

## 2018-09-09 ENCOUNTER — Ambulatory Visit: Payer: 59

## 2018-09-12 ENCOUNTER — Telehealth (HOSPITAL_COMMUNITY): Payer: Self-pay

## 2018-09-12 NOTE — Telephone Encounter (Signed)
Pt called requesting work note. Pt seen on 12/2 note given to go back to school on 12/5

## 2019-01-24 IMAGING — US US SCROTUM W/ DOPPLER COMPLETE
1 series · 13 of 25 positions shown · non-contrast
Comparison: None.

CLINICAL DATA: 16-year-old male with right-sided testicular pain x2
hours.

EXAM:
SCROTAL ULTRASOUND
DOPPLER ULTRASOUND OF THE TESTICLES
TECHNIQUE: Complete ultrasound examination of the testicles, epididymis, and
other scrotal structures was performed. Color and spectral Doppler
ultrasound were also utilized to evaluate blood flow to the
testicles.

[Series 1: us scrotum w/ doppler complete · 13 of 54 slices shown]
[im 1/54]
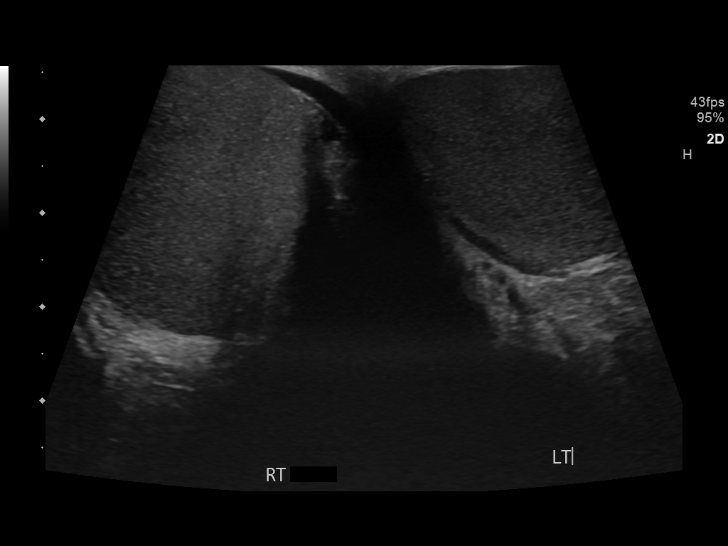
[im 5/54]
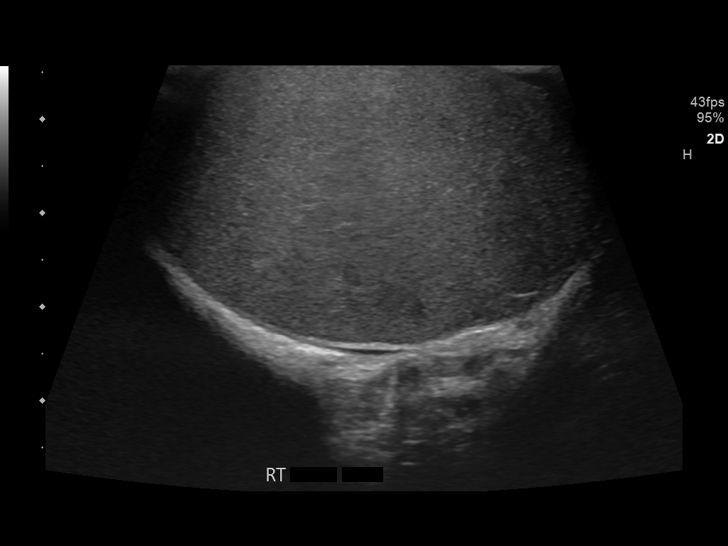
[im 9/54]
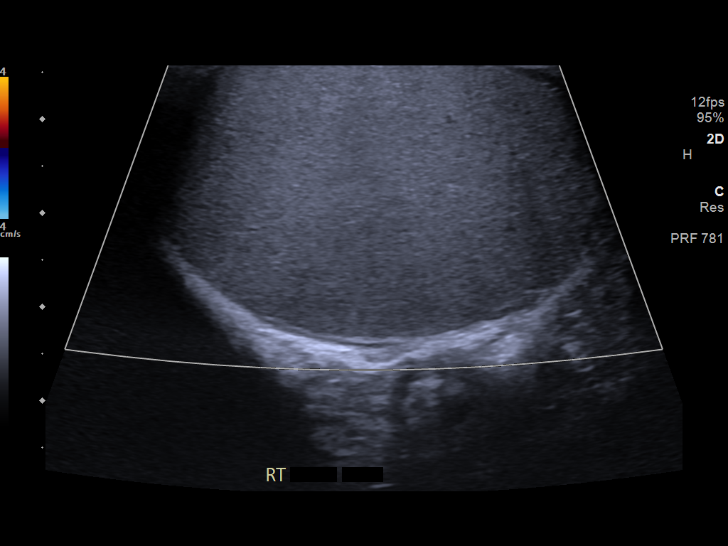
[im 14/54]
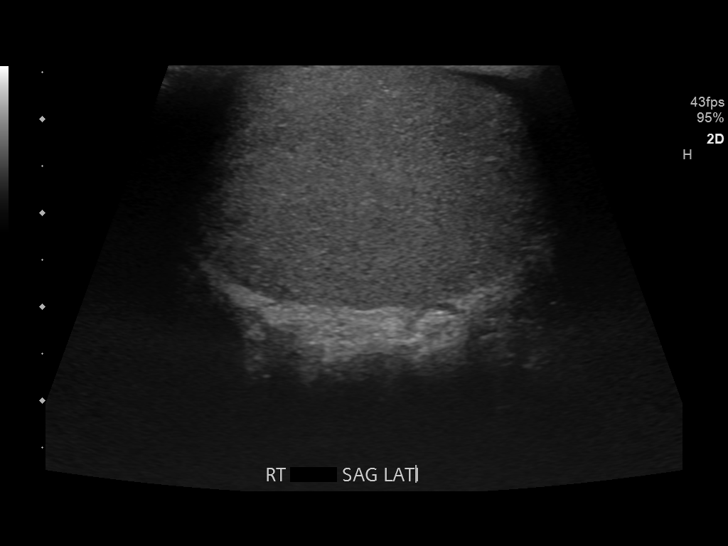
[im 18/54]
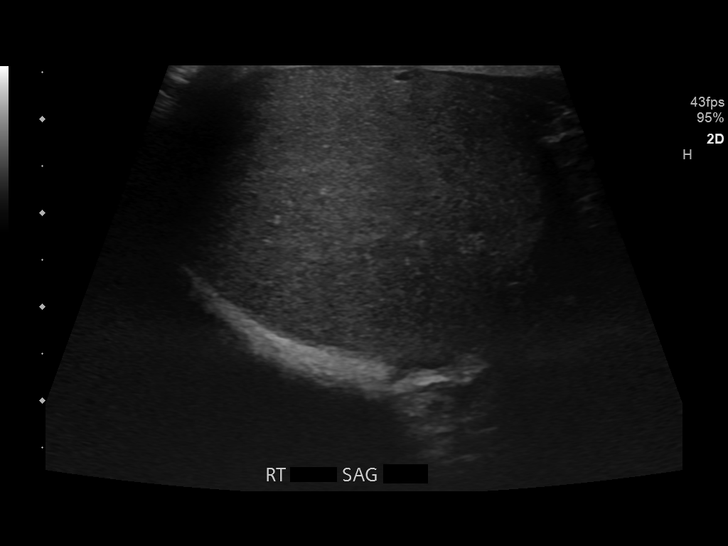
[im 23/54]
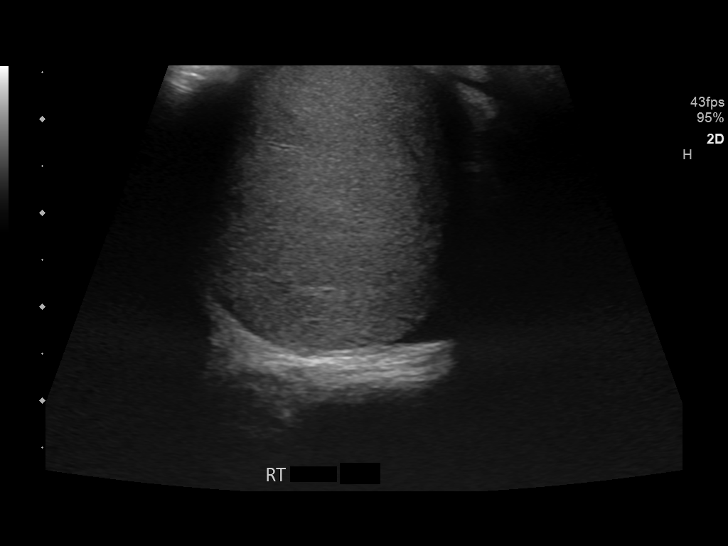
[im 27/54]
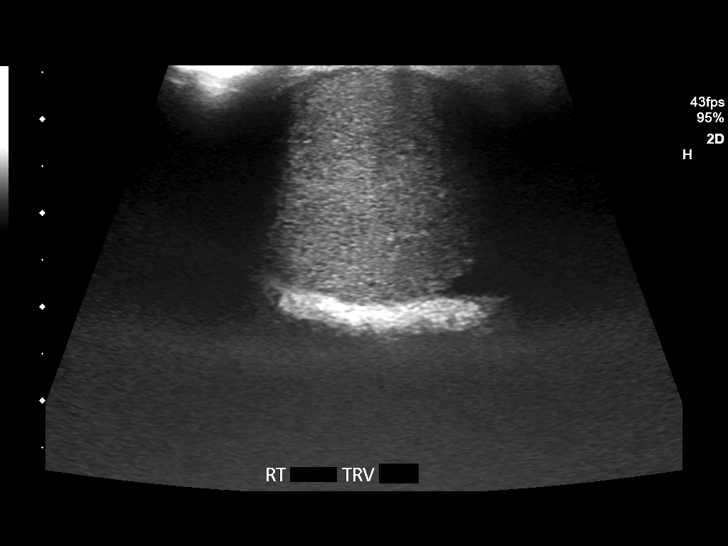
[im 31/54]
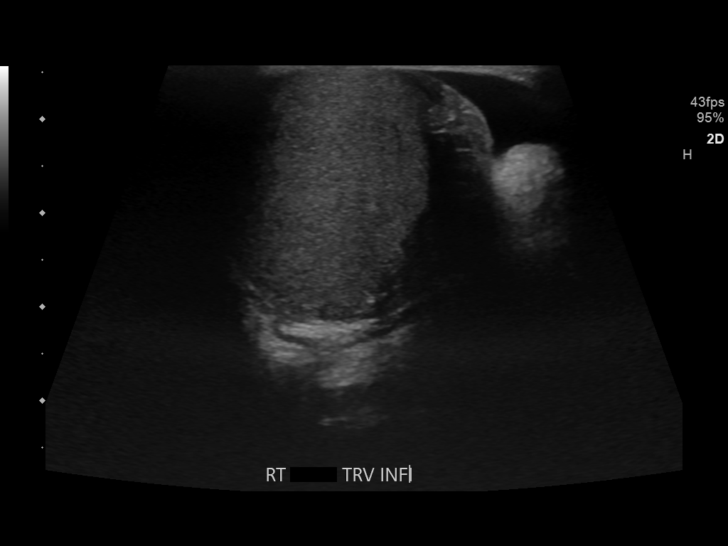
[im 36/54]
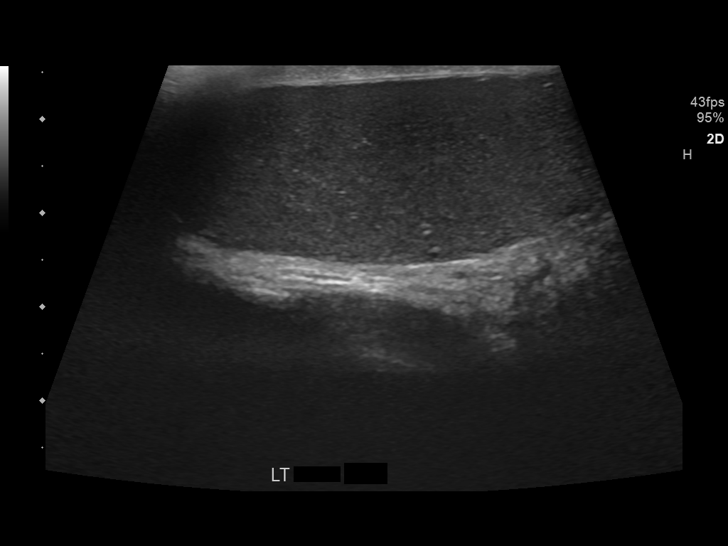
[im 40/54]
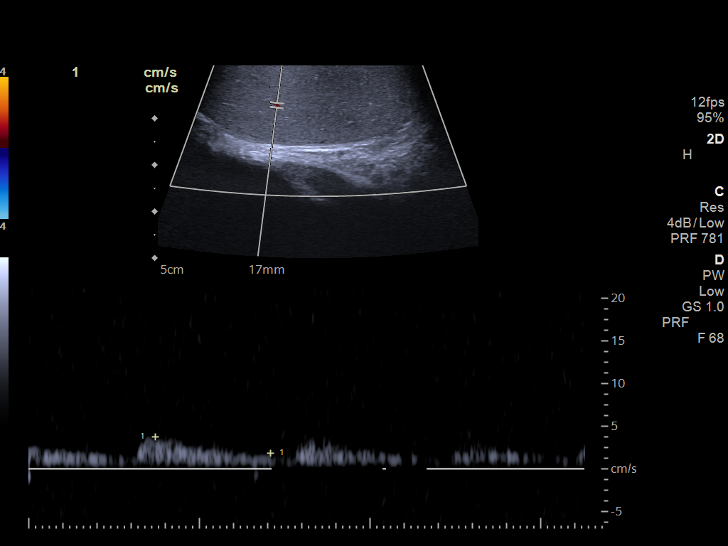
[im 45/54]
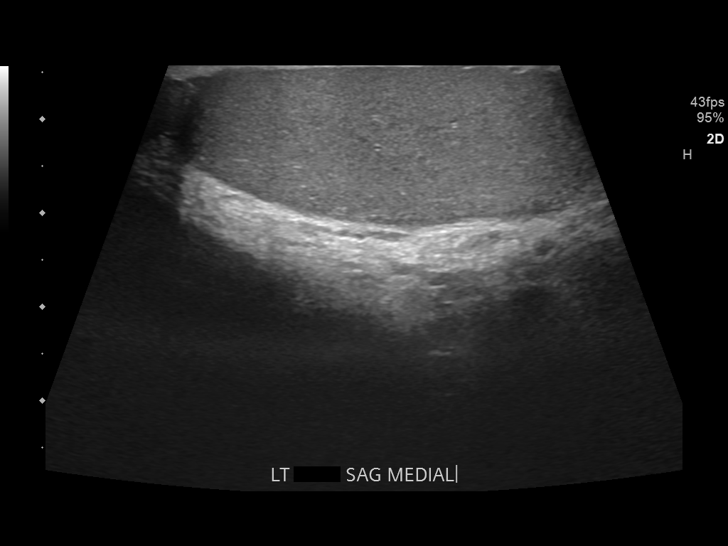
[im 49/54]
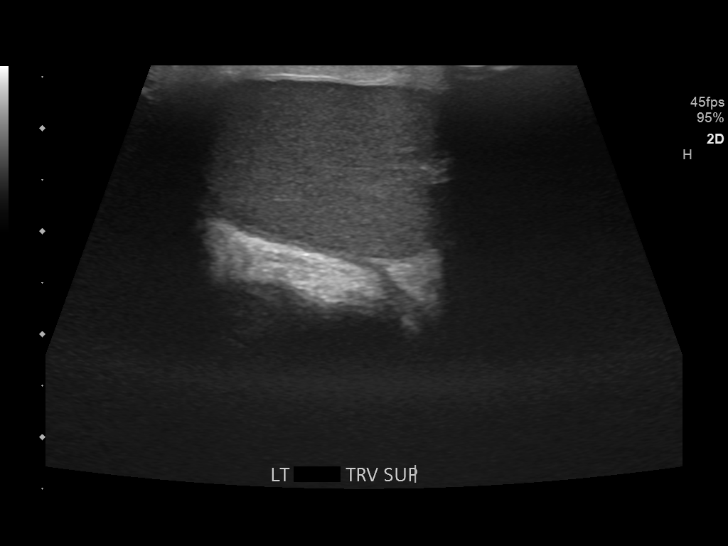
[im 54/54]
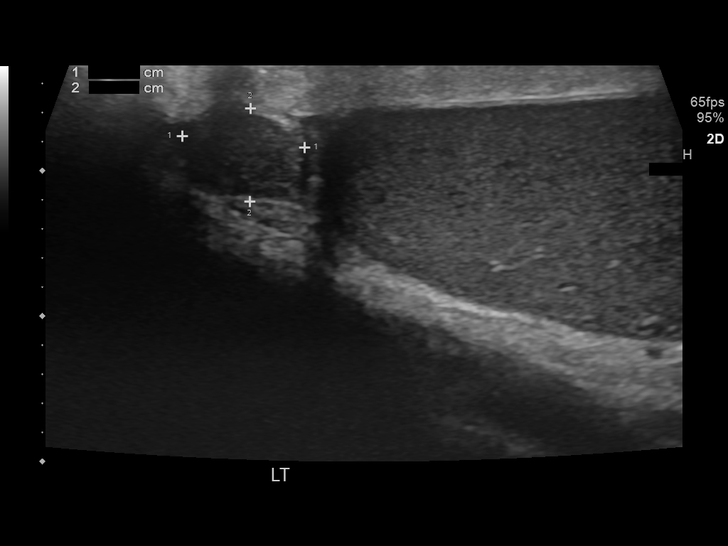

[13 of 25 positions shown; findings below may reference images not displayed]

FINDINGS: Right testicle

Measurements: 4.9 x 3.2 x 2.8 cm. The right testicle is mildly
rounded and hypoechoic in keeping with edema. No focal testicular
lesion.

Left testicle

Measurements: 4.6 x 2.1 x 2.9 cm. No mass or microlithiasis
visualized.

Right epididymis:  Normal in size and appearance.

Left epididymis:  Normal in size and appearance.

Hydrocele:  Trace right sided fluid, likely reactive.

Varicocele:  None visualized.

Pulsed Doppler interrogation of both testes demonstrates absence of
flow to the right testicle with normal low resistance arterial and
venous waveforms to the left testicle.
IMPRESSION: Sonographic findings most consistent with right testicular torsion.
Clinical correlation and surgical consult is advised.

These results were called by telephone at the time of interpretation
on 06/04/2018 at [DATE] to Dr. Najy, who verbally acknowledged
these results.

## 2020-02-02 ENCOUNTER — Ambulatory Visit (HOSPITAL_COMMUNITY)
Admission: EM | Admit: 2020-02-02 | Discharge: 2020-02-02 | Disposition: A | Discharge status: Home / Self Care | Payer: Self-pay | Attending: Family Medicine | Admitting: Family Medicine

## 2020-02-02 ENCOUNTER — Encounter (HOSPITAL_COMMUNITY): Payer: Self-pay

## 2020-02-02 ENCOUNTER — Other Ambulatory Visit: Payer: Self-pay

## 2020-02-02 DIAGNOSIS — L309 Dermatitis, unspecified: Secondary | ICD-10-CM

## 2020-02-02 MED ORDER — MOMETASONE FUROATE 0.1 % EX CREA: 1.0000 "application " | TOPICAL_CREAM | Freq: Every day | CUTANEOUS | 1 refills | Status: DC

## 2020-02-02 NOTE — ED Triage Notes (Signed)
Pt presents with chronic eczema all over body ; pt can not get in with dermatologist right now

## 2020-02-02 NOTE — Discharge Instructions (Addendum)
Use the medication as prescribed. Follow-up with dermatology as planned

## 2020-02-03 NOTE — ED Provider Notes (Signed)
[facility]    CSN: [id number] Arrival date & time: 02/02/20  1505      History   Chief Complaint Chief Complaint  Patient presents with  . Eczema    HPI MOHSIN CRUM is a 19 y.o. male.   Patient is a 19 year old male with past medical history of allergy, asthma, eczema.  He presents today with eczema flare.  He has chronic eczema in different areas throughout body.  He has attempted to get in to see dermatology but unable at this time.  Reporting that he has been using emollients without any relief.  He is also been doing hydrocortisone without any relief.  He did use his cousins mometasone which he reports helped.  Denies any fever, joint pain. Denies any recent changes in lotions, detergents, foods or other possible irritants. No recent travel. Nobody else at home has the rash. Patient has been outside but denies any contact with plants or insects. No new foods or medications.   ROS per HPI      Past Medical History:  Diagnosis Date  . Allergy   . Asthma   . Eczema   . Seasonal allergies     Patient Active Problem List   Diagnosis Date Noted  . Boil 11/14/2016  . 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 EGG ALLERGY 02/02/2009  . ECZEMA, ATOPIC DERMATITIS 12/05/2006    Past Surgical History:  Procedure Laterality Date  . ORCHIECTOMY Right 06/04/2018   Procedure: bilateral orciopexy,scrotal exploration;  Surgeon: Crista Elliot, MD;  Location: [facility];  Service: Urology;  Laterality: Right;       Home Medications    Prior to Admission medications   Medication Sig Start Date End Date Taking? Authorizing Provider  albuterol (PROVENTIL HFA;VENTOLIN HFA) 108 (90 Base) MCG/ACT inhaler Inhale 2 puffs into the lungs every 6 (six) hours as needed for wheezing or shortness of breath. 01/29/17   Hollice Gong, MD  beclomethasone (QVAR) 40 MCG/ACT inhaler Inhale 2 puffs into the lungs 2 (two) times daily. 06/10/13   Hayden Rasmussen, NP  cetirizine (ZYRTEC ALLERGY) 10  MG tablet Take 1 tablet (10 mg total) by mouth daily. 01/29/17   Hollice Gong, MD  EPINEPHrine (EPIPEN) 0.3 mg/0.3 mL DEVI Inject 0.3 mLs (0.3 mg total) into the muscle once. 06/01/11   Macy Mis, MD  fexofenadine (ALLEGRA) 180 MG tablet Take 1 tablet (180 mg total) by mouth daily. 06/10/13   Hayden Rasmussen, NP  fluticasone (FLONASE) 50 MCG/ACT nasal spray Place 2 sprays into the nose daily. 06/10/13   Hayden Rasmussen, NP  fluticasone (FLOVENT HFA) 44 MCG/ACT inhaler Inhale 2 puffs into the lungs 2 (two) times daily. 01/29/17   Hollice Gong, MD  mometasone (ELOCON) 0.1 % cream Apply 1 application topically daily. 02/02/20   Raetta Agostinelli, Gloris Manchester A, NP  predniSONE (STERAPRED UNI-PAK 21 TAB) 10 MG (21) TBPK tablet Take by mouth daily. Take 6 tabs by mouth day 1, then 5 tabs, then 4 tabs, then 3 tabs, 2 tabs, then 1 tab for the last day 09/08/18   Cathie Hoops, Amy V, PA-C  triamcinolone cream (KENALOG) 0.1 % Apply 1 application topically 2 (two) times daily. 09/08/18   Belinda Fisher, PA-C    Family History Family History  Problem Relation Age of Onset  . Kidney disease Maternal Grandfather 23       kidney failure  . Cancer Paternal Grandmother 20       b reast cancer  . Heart disease Neg  Hx   . Stroke Neg Hx     Social History Social History   Tobacco Use  . Smoking status: Never Smoker  . Smokeless tobacco: Never Used  Substance Use Topics  . Alcohol use: No  . Drug use: No     Allergies   Aspirin, Corn-containing products, Eggs or egg-derived products, and Peanut-containing drug products   Review of Systems Review of Systems   Physical Exam Triage Vital Signs ED Triage Vitals  Enc Vitals Group     BP 02/02/20 1534 122/69     Pulse Rate 02/02/20 1534 71     Resp 02/02/20 1534 18     Temp 02/02/20 1534 98.3 F (36.8 C)     Temp Source 02/02/20 1534 Oral     SpO2 02/02/20 1534 98 %     Weight --      Height --      Head Circumference --      Peak Flow --      Pain Score 02/02/20 1533 3      Pain Loc --      Pain Edu? --      Excl. in [place]? --    No data found.  Updated Vital Signs BP 122/69 (BP Location: Right Arm)   Pulse 71   Temp 98.3 F (36.8 C) (Oral)   Resp 18   SpO2 98%   Visual Acuity Right Eye Distance:   Left Eye Distance:   Bilateral Distance:    Right Eye Near:   Left Eye Near:    Bilateral Near:     Physical Exam Vitals and nursing note reviewed.  Constitutional:      Appearance: Normal appearance.  HENT:     Head: Normocephalic and atraumatic.     Nose: Nose normal.  Eyes:     Conjunctiva/sclera: Conjunctivae normal.  Pulmonary:     Effort: Pulmonary effort is normal.  Musculoskeletal:        General: Normal range of motion.     Cervical back: Normal range of motion.  Skin:    General: Skin is warm and dry.     Findings: Rash present.     Comments: Eczematous rash located to face, bilateral arms, legs and neck  Neurological:     Mental Status: He is alert.  Psychiatric:        Mood and Affect: Mood normal.      UC Treatments / Results  Labs (all labs ordered are listed, but only abnormal results are displayed) Labs Reviewed - No data to display  EKG   Radiology No results found.  Procedures Procedures (including critical care time)  Medications Ordered in UC Medications - No data to display  Initial Impression / Assessment and Plan / UC Course  I have reviewed the triage vital signs and the nursing notes.  Pertinent labs & imaging results that were available during my care of the patient were reviewed by me and considered in my medical decision making (see chart for details).     Eczema flare Treating with mometasone We will have him follow-up with dermatology for further management Final Clinical Impressions(s) / UC Diagnoses   Final diagnoses:  Eczema, unspecified type     Discharge Instructions     Use the medication as prescribed. Follow-up with dermatology as planned    ED Prescriptions     Medication Sig Dispense Auth. Provider   mometasone (ELOCON) 0.1 % cream Apply 1 application topically daily. 45 g Rozanna Box,  Jaxsin Bottomley A, NP     PDMP not reviewed this encounter.   Janace Aris, NP 02/03/20 1044

## 2020-02-23 ENCOUNTER — Ambulatory Visit (HOSPITAL_COMMUNITY)
Admission: EM | Admit: 2020-02-23 | Discharge: 2020-02-23 | Disposition: A | Discharge status: Home / Self Care | Payer: Medicaid Other | Attending: Urgent Care | Admitting: Urgent Care

## 2020-02-23 ENCOUNTER — Encounter (HOSPITAL_COMMUNITY): Payer: Self-pay

## 2020-02-23 ENCOUNTER — Other Ambulatory Visit: Payer: Self-pay

## 2020-02-23 DIAGNOSIS — T7840XA Allergy, unspecified, initial encounter: Secondary | ICD-10-CM

## 2020-02-23 DIAGNOSIS — T783XXA Angioneurotic edema, initial encounter: Secondary | ICD-10-CM

## 2020-02-23 MED ORDER — PREDNISONE 20 MG PO TABS: ORAL_TABLET | ORAL | 0 refills | Status: DC

## 2020-02-23 MED ORDER — METHYLPREDNISOLONE SODIUM SUCC 125 MG IJ SOLR
125.0000 mg | Freq: Once | INTRAMUSCULAR | Status: AC
  Administered 2020-02-23: 125 mg via INTRAVENOUS

## 2020-02-23 MED ORDER — SODIUM CHLORIDE 0.9 % IV BOLUS
250.0000 mL | Freq: Once | INTRAVENOUS | Status: AC
  Administered 2020-02-23: 250 mL via INTRAVENOUS

## 2020-02-23 MED ORDER — METHYLPREDNISOLONE SODIUM SUCC 125 MG IJ SOLR
INTRAMUSCULAR | Status: AC
  Filled 2020-02-23: qty 2

## 2020-02-23 MED ORDER — DIPHENHYDRAMINE HCL 50 MG/ML IJ SOLN
25.0000 mg | Freq: Once | INTRAMUSCULAR | Status: AC
  Administered 2020-02-23: 25 mg via INTRAVENOUS

## 2020-02-23 MED ORDER — DIPHENHYDRAMINE HCL 50 MG/ML IJ SOLN
INTRAMUSCULAR | Status: AC
  Filled 2020-02-23: qty 1

## 2020-02-23 NOTE — ED Provider Notes (Signed)
[facility]   MRN: [id number] DOB: Feb 20, 2001  Subjective:   Jerry Diaz is a 19 y.o. male presenting for 1 day hx of persistent lower lip swelling. Patient has an allergy to asthma, had a headache last night and used a Advertising account executive. His sx started thereafter. This morning, he came in due to persistent swelling of his lower lip.  Denies tongue swelling, eyelid swelling, chest pain, nausea, vomiting, belly pain.  He also has a history of asthma and has been wheezing some.  Uses his inhalers regularly.  No current facility-administered medications for this encounter.  Current Outpatient Medications:  .  albuterol (PROVENTIL HFA;VENTOLIN HFA) 108 (90 Base) MCG/ACT inhaler, Inhale 2 puffs into the lungs every 6 (six) hours as needed for wheezing or shortness of breath., Disp: 2 Inhaler, Rfl: 0 .  beclomethasone (QVAR) 40 MCG/ACT inhaler, Inhale 2 puffs into the lungs 2 (two) times daily., Disp: 1 Inhaler, Rfl: 12 .  cetirizine (ZYRTEC ALLERGY) 10 MG tablet, Take 1 tablet (10 mg total) by mouth daily., Disp: 30 tablet, Rfl: 1 .  EPINEPHrine (EPIPEN) 0.3 mg/0.3 mL DEVI, Inject 0.3 mLs (0.3 mg total) into the muscle once., Disp: 2 Device, Rfl: 1 .  fexofenadine (ALLEGRA) 180 MG tablet, Take 1 tablet (180 mg total) by mouth daily., Disp: 30 tablet, Rfl: 0 .  fluticasone (FLONASE) 50 MCG/ACT nasal spray, Place 2 sprays into the nose daily., Disp: 16 g, Rfl: 2 .  fluticasone (FLOVENT HFA) 44 MCG/ACT inhaler, Inhale 2 puffs into the lungs 2 (two) times daily., Disp: 2 Inhaler, Rfl: 0 .  mometasone (ELOCON) 0.1 % cream, Apply 1 application topically daily., Disp: 45 g, Rfl: 1 .  predniSONE (STERAPRED UNI-PAK 21 TAB) 10 MG (21) TBPK tablet, Take by mouth daily. Take 6 tabs by mouth day 1, then 5 tabs, then 4 tabs, then 3 tabs, 2 tabs, then 1 tab for the last day, Disp: 21 tablet, Rfl: 0 .  triamcinolone cream (KENALOG) 0.1 %, Apply 1 application topically 2 (two) times daily., Disp: 30 g, Rfl:  0   Allergies  Allergen Reactions  . Aspirin Shortness Of Breath    Feels like his chest "closes up"   . Corn-Containing Products   . Eggs Or Egg-Derived Products   . Peanut-Containing Drug Products     Past Medical History:  Diagnosis Date  . Allergy   . Asthma   . Eczema   . Seasonal allergies      Past Surgical History:  Procedure Laterality Date  . ORCHIECTOMY Right 06/04/2018   Procedure: bilateral orciopexy,scrotal exploration;  Surgeon: Crista Elliot, MD;  Location: [facility];  Service: Urology;  Laterality: Right;    Family History  Problem Relation Age of Onset  . Kidney disease Maternal Grandfather 23       kidney failure  . Cancer Paternal Grandmother 1       b reast cancer  . Heart disease Neg Hx   . Stroke Neg Hx     Social History   Tobacco Use  . Smoking status: Never Smoker  . Smokeless tobacco: Never Used  Substance Use Topics  . Alcohol use: No  . Drug use: No    ROS   Objective:   Vitals: BP 133/86   Pulse 69   Temp 97.9 F (36.6 C) (Oral)   Resp 16   Ht 6\' 1"  (1.854 m)   Wt 170 lb (77.1 kg)   SpO2 98%   BMI 22.43  kg/m   Physical Exam Constitutional:      General: He is not in acute distress.    Appearance: Normal appearance. He is well-developed. He is not ill-appearing, toxic-appearing or diaphoretic.  HENT:     Head: Normocephalic and atraumatic.     Right Ear: Tympanic membrane and external ear normal.     Left Ear: Tympanic membrane and external ear normal.     Nose: Nose normal.     Mouth/Throat:     Mouth: Mucous membranes are moist.     Pharynx: Oropharynx is clear. No oropharyngeal exudate or posterior oropharyngeal erythema.     Comments: Airway is patent.  No tongue swelling.  2+ edema left lower lip.  Patient is able to control secretions. Eyes:     General: No scleral icterus.       Right eye: No discharge.        Left eye: No discharge.     Extraocular Movements: Extraocular movements intact.      Conjunctiva/sclera: Conjunctivae normal.     Pupils: Pupils are equal, round, and reactive to light.  Cardiovascular:     Rate and Rhythm: Normal rate and regular rhythm.     Heart sounds: Normal heart sounds. No murmur. No friction rub. No gallop.   Pulmonary:     Effort: Pulmonary effort is normal. No respiratory distress.     Breath sounds: No stridor. Wheezing (mild, mid lung fields bilaterally) present. No rhonchi or rales.  Neurological:     Mental Status: He is alert and oriented to person, place, and time.  Psychiatric:        Mood and Affect: Mood normal.        Behavior: Behavior normal.        Thought Content: Thought content normal.        Judgment: Judgment normal.    IV Solumedrol at 125mg  given in clinic with 23mL bolus. IV benadryl at 25mg  also given in clinic.   Assessment and Plan :   PDMP not reviewed this encounter.  1. Allergic reaction, initial encounter   2. Angioedema, initial encounter     Patient does not want to be evaluated in ER.  Vital signs stable, physical exam findings consistent with allergic reaction, angioedema secondary to using aspirin.  Patient given medications as listed above.  Counseled on need to continue using Benadryl at home and maintain strict ER precautions.  He was agreeable to this that if he continues to have symptoms and definitely worsens he will report to the emergency room immediately with one of his friends/family driving him there. Counseled patient on potential for adverse effects with medications prescribed/recommended today, ER and return-to-clinic precautions discussed, patient verbalized understanding.    Jaynee Eagles, PA-C 02/23/20 1129

## 2020-02-23 NOTE — Discharge Instructions (Signed)
Take 25mg  benadryl once every 6 hours for the next 24-48 hours. Start the prednisone course tomorrow morning. If your symptoms persist and definitely if they worsen today, then please report to the ER.

## 2020-02-23 NOTE — ED Triage Notes (Signed)
Pt states he took goody powder and his chest got tight and his lips started swelling. Pt has 2+ edema of lips. Pt has non labored breathing. Skin color WNL. Pt has inspiratory wheezes in RUL and LUL of lungs.

## 2020-07-04 ENCOUNTER — Encounter (HOSPITAL_COMMUNITY): Payer: Self-pay | Admitting: *Deleted

## 2020-07-04 ENCOUNTER — Ambulatory Visit (HOSPITAL_COMMUNITY)
Admission: EM | Admit: 2020-07-04 | Discharge: 2020-07-04 | Disposition: A | Discharge status: Home / Self Care | Payer: Medicaid Other | Attending: Physician Assistant | Admitting: Physician Assistant

## 2020-07-04 ENCOUNTER — Other Ambulatory Visit: Payer: Self-pay

## 2020-07-04 DIAGNOSIS — L309 Dermatitis, unspecified: Secondary | ICD-10-CM

## 2020-07-04 MED ORDER — MOMETASONE FUROATE 0.1 % EX CREA: 1.0000 "application " | TOPICAL_CREAM | Freq: Every day | CUTANEOUS | 2 refills | Status: DC

## 2020-07-04 MED ORDER — KETOCONAZOLE 2 % EX SHAM: 1.0000 "application " | MEDICATED_SHAMPOO | CUTANEOUS | 1 refills | Status: DC

## 2020-07-04 NOTE — ED Provider Notes (Signed)
[facility]    CSN: [id number] Arrival date & time: 07/04/20  1616      History   Chief Complaint Chief Complaint  Patient presents with  . Rash    HPI Jerry Diaz is a 19 y.o. male.   Pt presents for flare up of eczema.  He reports he was seen here previously and prescribed mometasone which provided good relief.  When he ran out of the cream he began experiencing a flare up.  He reports he was seen by dermatology with no changes to treatment plan, expensive co-pays, cannot return at this time.  He also reports he has been prescribed ketoconazole shampoo in the past with good relief of scalp itchiness.  Pt requests refills of both today.      Past Medical History:  Diagnosis Date  . Allergy   . Asthma   . Eczema   . Seasonal allergies     Patient Active Problem List   Diagnosis Date Noted  . Boil 11/14/2016  . 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 EGG ALLERGY 02/02/2009  . ECZEMA, ATOPIC DERMATITIS 12/05/2006    Past Surgical History:  Procedure Laterality Date  . ORCHIECTOMY Right 06/04/2018   Procedure: bilateral orciopexy,scrotal exploration;  Surgeon: Crista Elliot, MD;  Location: [facility];  Service: Urology;  Laterality: Right;       Home Medications    Prior to Admission medications   Medication Sig Start Date End Date Taking? Authorizing Provider  albuterol (PROVENTIL HFA;VENTOLIN HFA) 108 (90 Base) MCG/ACT inhaler Inhale 2 puffs into the lungs every 6 (six) hours as needed for wheezing or shortness of breath. 01/29/17  Yes Hollice Gong, MD  beclomethasone (QVAR) 40 MCG/ACT inhaler Inhale 2 puffs into the lungs 2 (two) times daily. 06/10/13   Hayden Rasmussen, NP  cetirizine (ZYRTEC ALLERGY) 10 MG tablet Take 1 tablet (10 mg total) by mouth daily. 01/29/17   Hollice Gong, MD  EPINEPHrine (EPIPEN) 0.3 mg/0.3 mL DEVI Inject 0.3 mLs (0.3 mg total) into the muscle once. 06/01/11   Macy Mis, MD  fexofenadine (ALLEGRA) 180 MG tablet Take 1 tablet  (180 mg total) by mouth daily. 06/10/13   Hayden Rasmussen, NP  fluticasone (FLONASE) 50 MCG/ACT nasal spray Place 2 sprays into the nose daily. 06/10/13   Hayden Rasmussen, NP  fluticasone (FLOVENT HFA) 44 MCG/ACT inhaler Inhale 2 puffs into the lungs 2 (two) times daily. 01/29/17   Hollice Gong, MD  ketoconazole (NIZORAL) 2 % shampoo Apply 1 application topically 2 (two) times a week. 07/04/20   Josel Keo, Shanda Bumps, PA-C  mometasone (ELOCON) 0.1 % cream Apply 1 application topically daily. 07/04/20   Jodell Cipro, PA-C  predniSONE (DELTASONE) 20 MG tablet Take 2 tablets daily with breakfast. 02/23/20   Wallis Bamberg, PA-C  triamcinolone cream (KENALOG) 0.1 % Apply 1 application topically 2 (two) times daily. 09/08/18   Belinda Fisher, PA-C    Family History Family History  Problem Relation Age of Onset  . Kidney disease Maternal Grandfather 23       kidney failure  . Cancer Paternal Grandmother 63       b reast cancer  . Heart disease Neg Hx   . Stroke Neg Hx     Social History Social History   Tobacco Use  . Smoking status: Never Smoker  . Smokeless tobacco: Never Used  Substance Use Topics  . Alcohol use: No  . Drug use: No     Allergies   Aspirin,  Corn-containing products, Eggs or egg-derived products, and Peanut-containing drug products   Review of Systems Review of Systems  Constitutional: Negative for chills and fever.  HENT: Negative for ear pain, rhinorrhea, sinus pressure, sinus pain, sneezing and sore throat.   Eyes: Negative for pain and visual disturbance.  Respiratory: Negative for cough and shortness of breath.   Cardiovascular: Negative for chest pain and palpitations.  Gastrointestinal: Negative for abdominal pain and vomiting.  Genitourinary: Negative for dysuria and hematuria.  Musculoskeletal: Negative for arthralgias and back pain.  Skin: Positive for rash. Negative for color change and wound.  Neurological: Negative for seizures and syncope.  All other systems  reviewed and are negative.    Physical Exam Triage Vital Signs ED Triage Vitals  Enc Vitals Group     BP 07/04/20 1735 122/73     Pulse Rate 07/04/20 1735 66     Resp 07/04/20 1735 15     Temp 07/04/20 1735 98.7 F (37.1 C)     Temp Source 07/04/20 1735 Oral     SpO2 07/04/20 1735 100 %     Weight 07/04/20 1738 180 lb (81.6 kg)     Height 07/04/20 1738 6' (1.829 m)     Head Circumference --      Peak Flow --      Pain Score 07/04/20 1737 6     Pain Loc --      Pain Edu? --      Excl. in [place]? --    No data found.  Updated Vital Signs BP 122/73 (BP Location: Left Arm)   Pulse 66   Temp 98.7 F (37.1 C) (Oral)   Resp 15   Ht 6' (1.829 m)   Wt 180 lb (81.6 kg)   SpO2 100%   BMI 24.41 kg/m   Visual Acuity Right Eye Distance:   Left Eye Distance:   Bilateral Distance:    Right Eye Near:   Left Eye Near:    Bilateral Near:     Physical Exam Vitals and nursing note reviewed.  Constitutional:      Appearance: He is well-developed.  HENT:     Head: Normocephalic and atraumatic.  Eyes:     Conjunctiva/sclera: Conjunctivae normal.  Cardiovascular:     Rate and Rhythm: Normal rate and regular rhythm.     Heart sounds: No murmur heard.   Pulmonary:     Effort: Pulmonary effort is normal. No respiratory distress.     Breath sounds: Normal breath sounds.  Abdominal:     Palpations: Abdomen is soft.     Tenderness: There is no abdominal tenderness.  Musculoskeletal:     Cervical back: Neck supple.  Skin:    General: Skin is warm and dry.     Findings: Rash (Eczematous rash located to face, bilateral arms, legs and neck ) present.  Neurological:     Mental Status: He is alert.      UC Treatments / Results  Labs (all labs ordered are listed, but only abnormal results are displayed) Labs Reviewed - No data to display  EKG   Radiology No results found.  Procedures Procedures (including critical care time)  Medications Ordered in UC Medications - No  data to display  Initial Impression / Assessment and Plan / UC Course  I have reviewed the triage vital signs and the nursing notes.  Pertinent labs & imaging results that were available during my care of the patient were reviewed by me and considered in  my medical decision making (see chart for details).     Typical flare up of eczema.  Mometasone has historically provided significant relief, refilled today.  Pt also requesting ketoconazole shampoo for scalp itchiness, refilled today.  Advised to follow up with PCP if unable to follow up with dermatology.  Final Clinical Impressions(s) / UC Diagnoses   Final diagnoses:  Eczema, unspecified type   Discharge Instructions   None    ED Prescriptions    Medication Sig Dispense Auth. Provider   mometasone (ELOCON) 0.1 % cream Apply 1 application topically daily. 50 g Glenard Keesling, PA-C   ketoconazole (NIZORAL) 2 % shampoo Apply 1 application topically 2 (two) times a week. 120 mL Jodell Cipro, PA-C     PDMP not reviewed this encounter.   Jodell Cipro, PA-C 07/04/20 1754

## 2020-07-04 NOTE — ED Triage Notes (Signed)
PT here to day with skin rash reported from Eczema. Sores on bil legs and arms.

## 2021-02-02 ENCOUNTER — Ambulatory Visit (HOSPITAL_COMMUNITY)
Admission: EM | Admit: 2021-02-02 | Discharge: 2021-02-02 | Disposition: A | Discharge status: Home / Self Care | Payer: Medicaid Other | Attending: Physician Assistant | Admitting: Physician Assistant

## 2021-02-02 ENCOUNTER — Encounter (HOSPITAL_COMMUNITY): Payer: Self-pay | Admitting: Emergency Medicine

## 2021-02-02 ENCOUNTER — Other Ambulatory Visit: Payer: Self-pay

## 2021-02-02 DIAGNOSIS — R0789 Other chest pain: Secondary | ICD-10-CM

## 2021-02-02 DIAGNOSIS — J302 Other seasonal allergic rhinitis: Secondary | ICD-10-CM

## 2021-02-02 DIAGNOSIS — J4540 Moderate persistent asthma, uncomplicated: Secondary | ICD-10-CM

## 2021-02-02 MED ORDER — MOMETASONE FUROATE 0.1 % EX CREA: 1.0000 "application " | TOPICAL_CREAM | Freq: Every day | CUTANEOUS | 0 refills | Status: DC

## 2021-02-02 MED ORDER — ALBUTEROL SULFATE HFA 108 (90 BASE) MCG/ACT IN AERS
2.0000 | INHALATION_SPRAY | Freq: Four times a day (QID) | RESPIRATORY_TRACT | 0 refills | Status: DC | PRN
Start: 2021-02-02 — End: 2024-07-08

## 2021-02-02 MED ORDER — QVAR REDIHALER 80 MCG/ACT IN AERB
1.0000 | INHALATION_SPRAY | Freq: Two times a day (BID) | RESPIRATORY_TRACT | 0 refills | Status: DC
Start: 2021-02-02 — End: 2024-07-08

## 2021-02-02 MED ORDER — LEVOCETIRIZINE DIHYDROCHLORIDE 5 MG PO TABS: 5.0000 mg | ORAL_TABLET | Freq: Every evening | ORAL | 0 refills | Status: DC

## 2021-02-02 MED ORDER — ALBUTEROL SULFATE HFA 108 (90 BASE) MCG/ACT IN AERS
2.0000 | INHALATION_SPRAY | Freq: Once | RESPIRATORY_TRACT | Status: AC
  Administered 2021-02-02: 2 via RESPIRATORY_TRACT

## 2021-02-02 MED ORDER — FLUTICASONE PROPIONATE 50 MCG/ACT NA SUSP: 2.0000 | Freq: Every day | NASAL | 0 refills | Status: DC

## 2021-02-02 MED ORDER — ALBUTEROL SULFATE HFA 108 (90 BASE) MCG/ACT IN AERS
INHALATION_SPRAY | RESPIRATORY_TRACT | Status: AC
  Filled 2021-02-02: qty 6.7

## 2021-02-02 NOTE — ED Triage Notes (Signed)
Patient reports tightness with recent whether changes.  Sharp pain on sides of chest and sob.  Patient thinks weather changes and pollen are contributing to breathing issues.  Patient does NOT have albuterol inhaler

## 2021-02-02 NOTE — Discharge Instructions (Addendum)
Please restart your asthma medication.  Please use albuterol as needed for shortness of breath.  Take Qvar twice daily and make sure to rinse your mouth following use of this medication to prevent thrush.  Use Xyzal and Flonase for allergy relief.  If you have any persistent or worsening symptoms you need to be reevaluated as we discussed.

## 2021-02-02 NOTE — ED Provider Notes (Signed)
[facility]    CSN: [id number] Arrival date & time: 02/02/21  1312      History   Chief Complaint Chief Complaint  Patient presents with  . Asthma    HPI Jerry Diaz is a 20 y.o. male.   Patient presents today with a 1 week history of chest tightness and shortness of breath.  He has a history of asthma and is previously managed with albuterol and inhaled corticosteroids.  He has been without this medication for several months.  Reports symptoms began soon after allergies worsened.  He has been taking Zyrtec did not find this to be effective.  He reports some nasal congestion but denies any recent illness.  He denies any recent antibiotic use.  He has previously required prednisone when feeling similar symptoms but has not taken this medication recently.  He denies nocturnal symptoms, previous hospitalization for asthma, previous intubation due to asthma.     Past Medical History:  Diagnosis Date  . Allergy   . Asthma   . Eczema   . Seasonal allergies     Patient Active Problem List   Diagnosis Date Noted  . Boil 11/14/2016  . 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 EGG ALLERGY 02/02/2009  . ECZEMA, ATOPIC DERMATITIS 12/05/2006    Past Surgical History:  Procedure Laterality Date  . ORCHIECTOMY Right 06/04/2018   Procedure: bilateral orciopexy,scrotal exploration;  Surgeon: Crista Elliot, MD;  Location: [facility];  Service: Urology;  Laterality: Right;       Home Medications    Prior to Admission medications   Medication Sig Start Date End Date Taking? Authorizing Provider  beclomethasone (QVAR REDIHALER) 80 MCG/ACT inhaler Inhale 1 puff into the lungs 2 (two) times daily. 02/02/21  Yes Ashaz Robling, Denny Peon K, PA-C  levocetirizine (XYZAL) 5 MG tablet Take 1 tablet (5 mg total) by mouth every evening. 02/02/21  Yes Barclay Lennox K, PA-C  albuterol (VENTOLIN HFA) 108 (90 Base) MCG/ACT inhaler Inhale 2 puffs into the lungs every 6 (six) hours as needed for wheezing or  shortness of breath. 02/02/21   Derold Dorsch, Noberto Retort, PA-C  EPINEPHrine (EPIPEN) 0.3 mg/0.3 mL DEVI Inject 0.3 mLs (0.3 mg total) into the muscle once. 06/01/11   Macy Mis, MD  fluticasone (FLONASE) 50 MCG/ACT nasal spray Place 2 sprays into both nostrils daily. 02/02/21   Arnoldo Hildreth, Noberto Retort, PA-C  ketoconazole (NIZORAL) 2 % shampoo Apply 1 application topically 2 (two) times a week. 07/04/20   Zanetto, Shanda Bumps, PA-C  mometasone (ELOCON) 0.1 % cream Apply 1 application topically daily. 02/02/21   Elijha Dedman, Noberto Retort, PA-C  triamcinolone cream (KENALOG) 0.1 % Apply 1 application topically 2 (two) times daily. 09/08/18   Cathie Hoops, Amy V, PA-C  beclomethasone (QVAR) 40 MCG/ACT inhaler Inhale 2 puffs into the lungs 2 (two) times daily. Patient not taking: Reported on 02/02/2021 06/10/13 02/02/21  Hayden Rasmussen, NP  cetirizine (ZYRTEC ALLERGY) 10 MG tablet Take 1 tablet (10 mg total) by mouth daily. Patient not taking: Reported on 02/02/2021 01/29/17 02/02/21  Hollice Gong, MD  fexofenadine (ALLEGRA) 180 MG tablet Take 1 tablet (180 mg total) by mouth daily. Patient not taking: Reported on 02/02/2021 06/10/13 02/02/21  Hayden Rasmussen, NP  fluticasone (FLOVENT HFA) 44 MCG/ACT inhaler Inhale 2 puffs into the lungs 2 (two) times daily. Patient not taking: Reported on 02/02/2021 01/29/17 02/02/21  Hollice Gong, MD    Family History Family History  Problem Relation Age of Onset  . Kidney disease Maternal Grandfather  23       kidney failure  . Cancer Paternal Grandmother 58       b reast cancer  . Heart disease Neg Hx   . Stroke Neg Hx     Social History Social History   Tobacco Use  . Smoking status: Current Some Day Smoker  . Smokeless tobacco: Never Used  Vaping Use  . Vaping Use: Never used  Substance Use Topics  . Alcohol use: No  . Drug use: No     Allergies   Aspirin, Corn-containing products, Eggs or egg-derived products, and Peanut-containing drug products   Review of Systems Review of Systems   Constitutional: Negative for activity change, appetite change, fatigue and fever.  HENT: Positive for congestion and sneezing. Negative for sinus pressure and sore throat.   Respiratory: Positive for chest tightness, shortness of breath and wheezing. Negative for cough.   Cardiovascular: Negative for chest pain.  Gastrointestinal: Negative for abdominal pain, diarrhea, nausea and vomiting.  Neurological: Negative for dizziness, light-headedness and headaches.     Physical Exam Triage Vital Signs ED Triage Vitals [02/02/21 1359]  Enc Vitals Group     BP      Pulse      Resp      Temp      Temp src      SpO2      Weight      Height      Head Circumference      Peak Flow      Pain Score 5     Pain Loc      Pain Edu?      Excl. in GC?    No data found.  Updated Vital Signs BP 124/68 (BP Location: Left Arm)   Pulse 68   Temp 99.2 F (37.3 C) (Oral)   Resp 18   SpO2 98%   Visual Acuity Right Eye Distance:   Left Eye Distance:   Bilateral Distance:    Right Eye Near:   Left Eye Near:    Bilateral Near:     Physical Exam Vitals reviewed.  Constitutional:      General: He is awake.     Appearance: Normal appearance. He is normal weight. He is not ill-appearing.     Comments: Very pleasant male appears stated age in no acute distress  HENT:     Head: Normocephalic and atraumatic.     Right Ear: Tympanic membrane, ear canal and external ear normal. Tympanic membrane is not erythematous or bulging.     Left Ear: Tympanic membrane, ear canal and external ear normal. Tympanic membrane is not erythematous or bulging.     Nose:     Right Sinus: Maxillary sinus tenderness and frontal sinus tenderness present.     Left Sinus: Maxillary sinus tenderness and frontal sinus tenderness present.     Mouth/Throat:     Pharynx: Uvula midline. Posterior oropharyngeal erythema present. No oropharyngeal exudate.     Comments: Moderate erythema and drainage in posterior  oropharynx Cardiovascular:     Rate and Rhythm: Normal rate and regular rhythm.     Heart sounds: No murmur heard.   Pulmonary:     Effort: Pulmonary effort is normal. No accessory muscle usage or respiratory distress.     Breath sounds: Normal breath sounds. No stridor. No wheezing, rhonchi or rales.     Comments: Clear to auscultation.  Reactive cough with deep breathing. Abdominal:     General: Bowel sounds are  normal.     Palpations: Abdomen is soft.     Tenderness: There is no abdominal tenderness.  Lymphadenopathy:     Head:     Right side of head: No submental, submandibular or tonsillar adenopathy.     Left side of head: No submental, submandibular or tonsillar adenopathy.     Cervical: No cervical adenopathy.  Neurological:     Mental Status: He is alert.  Psychiatric:        Behavior: Behavior is cooperative.      UC Treatments / Results  Labs (all labs ordered are listed, but only abnormal results are displayed) Labs Reviewed - No data to display  EKG   Radiology No results found.  Procedures Procedures (including critical care time)  Medications Ordered in UC Medications  albuterol (VENTOLIN HFA) 108 (90 Base) MCG/ACT inhaler 2 puff (2 puffs Inhalation Given 02/02/21 1420)    Initial Impression / Assessment and Plan / UC Course  I have reviewed the triage vital signs and the nursing notes.  Pertinent labs & imaging results that were available during my care of the patient were reviewed by me and considered in my medical decision making (see chart for details).     Suspect symptoms are related to untreated asthma triggered by allergy symptoms.  Patient was given 2 puffs of albuterol in office today with improvement of symptoms.  We will restart Qvar and patient was instructed to rinse mouth following use of this medication to avoid thrush.  He was given refill of albuterol.  We will start Xyzal and Flonase for allergy symptoms.  We will hold off on  starting prednisone for the time being.  Strict return precautions given to which patient expressed understanding.  At the end of visit, patient requested refill of mometasone to treat atopic dermatitis.  Refill was provided as requested and he was encouraged to follow-up with dermatology given persistent symptoms to which he expressed understanding.  Final Clinical Impressions(s) / UC Diagnoses   Final diagnoses:  Moderate persistent asthma without complication  Chest tightness  Seasonal allergies     Discharge Instructions     Please restart your asthma medication.  Please use albuterol as needed for shortness of breath.  Take Qvar twice daily and make sure to rinse your mouth following use of this medication to prevent thrush.  Use Xyzal and Flonase for allergy relief.  If you have any persistent or worsening symptoms you need to be reevaluated as we discussed.    ED Prescriptions    Medication Sig Dispense Auth. Provider   albuterol (VENTOLIN HFA) 108 (90 Base) MCG/ACT inhaler Inhale 2 puffs into the lungs every 6 (six) hours as needed for wheezing or shortness of breath. 8 g Fender Herder K, PA-C   fluticasone (FLONASE) 50 MCG/ACT nasal spray Place 2 sprays into both nostrils daily. 16 g Melonie Germani K, PA-C   levocetirizine (XYZAL) 5 MG tablet Take 1 tablet (5 mg total) by mouth every evening. 30 tablet Delylah Stanczyk K, PA-C   beclomethasone (QVAR REDIHALER) 80 MCG/ACT inhaler Inhale 1 puff into the lungs 2 (two) times daily. 10.6 g Hymen Arnett K, PA-C   mometasone (ELOCON) 0.1 % cream Apply 1 application topically daily. 50 g Noelle Hoogland K, PA-C     PDMP not reviewed this encounter.   Jeani Hawking, PA-C 02/02/21 1431

## 2021-08-24 ENCOUNTER — Ambulatory Visit (HOSPITAL_COMMUNITY)
Admission: EM | Admit: 2021-08-24 | Discharge: 2021-08-24 | Disposition: A | Discharge status: Home / Self Care | Payer: Medicaid Other | Attending: Urgent Care | Admitting: Urgent Care

## 2021-08-24 ENCOUNTER — Other Ambulatory Visit: Payer: Self-pay

## 2021-08-24 ENCOUNTER — Encounter (HOSPITAL_COMMUNITY): Payer: Self-pay | Admitting: Urgent Care

## 2021-08-24 DIAGNOSIS — L308 Other specified dermatitis: Secondary | ICD-10-CM

## 2021-08-24 MED ORDER — PREDNISONE 20 MG PO TABS
ORAL_TABLET | ORAL | 0 refills | Status: DC
Start: 2021-08-24 — End: 2021-12-20

## 2021-08-24 MED ORDER — BETAMETHASONE DIPROPIONATE 0.05 % EX OINT
TOPICAL_OINTMENT | Freq: Two times a day (BID) | CUTANEOUS | 0 refills | Status: DC
Start: 2021-08-24 — End: 2024-07-08

## 2021-08-24 NOTE — ED Provider Notes (Signed)
[facility]   MRN: [id number] DOB: 06/21/2001  Subjective:   Jerry Diaz is a 20 y.o. male presenting for an evaluation of his eczema. Has had a bad flare up for the past several months. Has previously used mometasone. Last script was ~1 year ago.   No current facility-administered medications for this encounter.  Current Outpatient Medications:    albuterol (VENTOLIN HFA) 108 (90 Base) MCG/ACT inhaler, Inhale 2 puffs into the lungs every 6 (six) hours as needed for wheezing or shortness of breath., Disp: 8 g, Rfl: 0   beclomethasone (QVAR REDIHALER) 80 MCG/ACT inhaler, Inhale 1 puff into the lungs 2 (two) times daily., Disp: 10.6 g, Rfl: 0   EPINEPHrine (EPIPEN) 0.3 mg/0.3 mL DEVI, Inject 0.3 mLs (0.3 mg total) into the muscle once., Disp: 2 Device, Rfl: 1   fluticasone (FLONASE) 50 MCG/ACT nasal spray, Place 2 sprays into both nostrils daily., Disp: 16 g, Rfl: 0   ketoconazole (NIZORAL) 2 % shampoo, Apply 1 application topically 2 (two) times a week., Disp: 120 mL, Rfl: 1   levocetirizine (XYZAL) 5 MG tablet, Take 1 tablet (5 mg total) by mouth every evening., Disp: 30 tablet, Rfl: 0   mometasone (ELOCON) 0.1 % cream, Apply 1 application topically daily., Disp: 50 g, Rfl: 0   triamcinolone cream (KENALOG) 0.1 %, Apply 1 application topically 2 (two) times daily., Disp: 30 g, Rfl: 0   Allergies  Allergen Reactions   Aspirin Shortness Of Breath    Feels like his chest "closes up"    Corn-Containing Products    Eggs Or Egg-Derived Products    Peanut-Containing Drug Products     Past Medical History:  Diagnosis Date   Allergy    Asthma    Eczema    Seasonal allergies      Past Surgical History:  Procedure Laterality Date   ORCHIECTOMY Right 06/04/2018   Procedure: bilateral orciopexy,scrotal exploration;  Surgeon: Crista Elliot, MD;  Location: [facility];  Service: Urology;  Laterality: Right;    Family History  Problem Relation Age of Onset    Kidney disease Maternal Grandfather 23       kidney failure   Cancer Paternal Grandmother 9       b reast cancer   Heart disease Neg Hx    Stroke Neg Hx     Social History   Tobacco Use   Smoking status: Some Days   Smokeless tobacco: Never  Vaping Use   Vaping Use: Never used  Substance Use Topics   Alcohol use: No   Drug use: No    ROS   Objective:   Vitals: BP 114/75 (BP Location: Left Arm)   Pulse (!) 52   Temp 98.9 F (37.2 C) (Oral)   Resp 16   SpO2 97%   Physical Exam Constitutional:      General: He is not in acute distress.    Appearance: Normal appearance. He is well-developed and normal weight. He is not ill-appearing, toxic-appearing or diaphoretic.  HENT:     Head: Normocephalic and atraumatic.     Right Ear: External ear normal.     Left Ear: External ear normal.     Nose: Nose normal.     Mouth/Throat:     Pharynx: Oropharynx is clear.  Eyes:     General: No scleral icterus.       Right eye: No discharge.        Left eye: No discharge.  Extraocular Movements: Extraocular movements intact.     Pupils: Pupils are equal, round, and reactive to light.  Cardiovascular:     Rate and Rhythm: Normal rate.  Pulmonary:     Effort: Pulmonary effort is normal.  Musculoskeletal:     Cervical back: Normal range of motion.  Skin:    Findings: Rash (large plaques of dry scaly skin over flexural surfaces, anterior lower legs, to a lesser extent over the face as well) present.  Neurological:     Mental Status: He is alert and oriented to person, place, and time.  Psychiatric:        Mood and Affect: Mood normal.        Behavior: Behavior normal.        Thought Content: Thought content normal.        Judgment: Judgment normal.    Assessment and Plan :   PDMP not reviewed this encounter.  1. Other eczema     Given the extent of his eczema flareup we will use a 10-day course of prednisone.  He can use betamethasone ointment thereafter.   Discussed appropriate use of topical steroids.  Follow-up with [place] [facility]. Counseled patient on potential for adverse effects with medications prescribed/recommended today, ER and return-to-clinic precautions discussed, patient verbalized understanding.    Wallis Bamberg, PA-C 08/24/21 1859

## 2021-08-24 NOTE — ED Triage Notes (Signed)
Pt c/o eczema flare up

## 2021-12-20 ENCOUNTER — Other Ambulatory Visit: Payer: Self-pay

## 2021-12-20 ENCOUNTER — Emergency Department (HOSPITAL_BASED_OUTPATIENT_CLINIC_OR_DEPARTMENT_OTHER)
Admission: EM | Admit: 2021-12-20 | Discharge: 2021-12-20 | Disposition: A | Discharge status: Home / Self Care | Payer: Medicaid Other | Attending: Emergency Medicine | Admitting: Emergency Medicine

## 2021-12-20 ENCOUNTER — Encounter (HOSPITAL_BASED_OUTPATIENT_CLINIC_OR_DEPARTMENT_OTHER): Payer: Self-pay

## 2021-12-20 DIAGNOSIS — Z79899 Other long term (current) drug therapy: Secondary | ICD-10-CM | POA: Insufficient documentation

## 2021-12-20 DIAGNOSIS — Z91018 Allergy to other foods: Secondary | ICD-10-CM | POA: Insufficient documentation

## 2021-12-20 DIAGNOSIS — L309 Dermatitis, unspecified: Secondary | ICD-10-CM | POA: Insufficient documentation

## 2021-12-20 MED ORDER — MOMETASONE FUROATE 0.1 % EX CREA: 1.0000 "application " | TOPICAL_CREAM | Freq: Every day | CUTANEOUS | 1 refills | Status: DC

## 2021-12-20 MED ORDER — PREDNISONE 10 MG PO TABS: ORAL_TABLET | ORAL | 0 refills | Status: DC

## 2021-12-20 NOTE — Discharge Instructions (Addendum)
Follow-up with your dermatologist 

## 2021-12-20 NOTE — ED Provider Notes (Signed)
?[facility] ?Provider Note ? ? ?CSN: [id number] ?Arrival date & time: 12/20/21  1644 ? ?  ? ?History ? ?Chief Complaint  ?Patient presents with  ? Rash  ? ? ?Jerry Diaz is a 21 y.o. male. ? ?Pt has a history of eczema.  Pt currently is out of medications and has itching and severe outbreak ? ?The history is provided by the patient. No language interpreter was used.  ?Rash ?Location:  Full body ?Quality: dryness and peeling   ?Severity:  Moderate ?Onset quality:  Gradual ?Timing:  Constant ?Progression:  Worsening ?Relieved by:  Nothing ?Worsened by:  Nothing ? ?  ? ?Home Medications ?Prior to Admission medications   ?Medication Sig Start Date End Date Taking? Authorizing Provider  ?predniSONE (DELTASONE) 10 MG tablet 6,6,5,5,4,4,3,3,2,2,1,1 taper 12/20/21  Yes Cheron Schaumann K, PA-C  ?albuterol (VENTOLIN HFA) 108 (90 Base) MCG/ACT inhaler Inhale 2 puffs into the lungs every 6 (six) hours as needed for wheezing or shortness of breath. 02/02/21   Raspet, Noberto Retort, PA-C  ?beclomethasone (QVAR REDIHALER) 80 MCG/ACT inhaler Inhale 1 puff into the lungs 2 (two) times daily. 02/02/21   Raspet, Noberto Retort, PA-C  ?betamethasone dipropionate (DIPROLENE) 0.05 % ointment Apply topically 2 (two) times daily. 08/24/21   Wallis Bamberg, PA-C  ?EPINEPHrine (EPIPEN) 0.3 mg/0.3 mL DEVI Inject 0.3 mLs (0.3 mg total) into the muscle once. 06/01/11   Macy Mis, MD  ?fluticasone (FLONASE) 50 MCG/ACT nasal spray Place 2 sprays into both nostrils daily. 02/02/21   Raspet, Noberto Retort, PA-C  ?ketoconazole (NIZORAL) 2 % shampoo Apply 1 application topically 2 (two) times a week. 07/04/20   Ward, Tylene Fantasia, PA-C  ?levocetirizine (XYZAL) 5 MG tablet Take 1 tablet (5 mg total) by mouth every evening. 02/02/21   Raspet, Noberto Retort, PA-C  ?mometasone (ELOCON) 0.1 % cream Apply 1 application. topically daily. Apply to affect areas 12/20/21   Elson Areas, PA-C  ?triamcinolone cream (KENALOG) 0.1 % Apply 1 application topically 2  (two) times daily. 09/08/18   Cathie Hoops, Amy V, PA-C  ?beclomethasone (QVAR) 40 MCG/ACT inhaler Inhale 2 puffs into the lungs 2 (two) times daily. ?Patient not taking: Reported on 02/02/2021 06/10/13 02/02/21  Hayden Rasmussen, NP  ?cetirizine (ZYRTEC ALLERGY) 10 MG tablet Take 1 tablet (10 mg total) by mouth daily. ?Patient not taking: Reported on 02/02/2021 01/29/17 02/02/21  Hollice Gong, MD  ?fexofenadine (ALLEGRA) 180 MG tablet Take 1 tablet (180 mg total) by mouth daily. ?Patient not taking: Reported on 02/02/2021 06/10/13 02/02/21  Hayden Rasmussen, NP  ?fluticasone (FLOVENT HFA) 44 MCG/ACT inhaler Inhale 2 puffs into the lungs 2 (two) times daily. ?Patient not taking: Reported on 02/02/2021 01/29/17 02/02/21  Hollice Gong, MD  ?   ? ?Allergies    ?Aspirin, Corn-containing products, Eggs or egg-derived products, and Peanut-containing drug products   ? ?Review of Systems   ?Review of Systems  ?Skin:  Positive for rash.  ?All other systems reviewed and are negative. ? ?Physical Exam ?Updated Vital Signs ?BP (!) 139/91 (BP Location: Right Arm)   Pulse 73   Temp 98.5 ?F (36.9 ?C) (Oral)   Resp 16   Ht 6' (1.829 m)   Wt 81.6 kg   SpO2 100%   BMI 24.40 kg/m?  ?Physical Exam ?Vitals and nursing note reviewed.  ?Constitutional:   ?   Appearance: He is well-developed.  ?HENT:  ?   Head: Normocephalic.  ?Cardiovascular:  ?   Rate and Rhythm: Normal rate.  ?  Pulmonary:  ?   Effort: Pulmonary effort is normal.  ?Abdominal:  ?   General: There is no distension.  ?Musculoskeletal:     ?   General: Normal range of motion.  ?   Cervical back: Normal range of motion.  ?Skin: ?   Findings: Rash present.  ?   Comments: Dried scaling areas and open cracked areas  ?Neurological:  ?   General: No focal deficit present.  ?   Mental Status: He is alert and oriented to person, place, and time.  ?Psychiatric:     ?   Mood and Affect: Mood normal.  ? ? ?ED Results / Procedures / Treatments   ?Labs ?(all labs ordered are listed, but only abnormal results  are displayed) ?Labs Reviewed - No data to display ? ?EKG ?None ? ?Radiology ?No results found. ? ?Procedures ?Procedures  ? ? ?Medications Ordered in ED ?Medications - No data to display ? ?ED Course/ Medical Decision Making/ A&P ?  ?                        ?Medical Decision Making ?Risk ?Prescription drug management. ? ? ?MDM:  Ptstates he has been on oral steroids when rash has gotten this bad in the past.  Pt request elocon ointment.  Pt reports this has worked best in the past  ? ? ? ? ? ? ? ?Final Clinical Impression(s) / ED Diagnoses ?Final diagnoses:  ?Eczema, unspecified type  ? ? ?Rx / DC Orders ?ED Discharge Orders   ? ?      Ordered  ?  mometasone (ELOCON) 0.1 % cream  Daily       ? 12/20/21 1745  ?  predniSONE (DELTASONE) 10 MG tablet       ?Note to Pharmacy: Please provide dose pack or taper instructions  ? 12/20/21 1745  ? ?  ?  ? ?  ? ?An After Visit Summary was printed and given to the patient. ? ?  ?Elson Areas, [place] ?12/20/21 2202 ? ?  ?Cathren Laine, MD ?12/21/21 1449 ? ?

## 2021-12-20 NOTE — ED Triage Notes (Signed)
Patient here POV from Home with Eczema. ? ?Patient states ever since he received the COVID-19 Booster he has been having more Severe and Frequent Eczema Episodes.  ? ?Has not seen Dermatologist due to [employer].  ? ?NAD Noted during Triage. A&Ox4. GCS 15. Ambulatory. ?

## 2023-10-01 ENCOUNTER — Other Ambulatory Visit: Payer: Self-pay

## 2023-10-01 ENCOUNTER — Ambulatory Visit
Admission: RE | Admit: 2023-10-01 | Discharge: 2023-10-01 | Disposition: A | Discharge status: Home / Self Care | Payer: Medicaid Other | Source: Ambulatory Visit | Attending: Family Medicine | Admitting: Family Medicine

## 2023-10-01 ENCOUNTER — Ambulatory Visit (INDEPENDENT_AMBULATORY_CARE_PROVIDER_SITE_OTHER): Payer: Medicaid Other

## 2023-10-01 VITALS — BP 121/72 | HR 91 | Temp 99.0°F | Resp 18

## 2023-10-01 DIAGNOSIS — M79672 Pain in left foot: Secondary | ICD-10-CM

## 2023-10-01 MED ORDER — IBUPROFEN 800 MG PO TABS: 800.0000 mg | ORAL_TABLET | Freq: Three times a day (TID) | ORAL | 0 refills | Status: DC

## 2023-10-01 NOTE — ED Provider Notes (Signed)
[facility]   [id number] 10/01/23 Arrival Time: 1528  ASSESSMENT & PLAN:  1. Left foot pain    I have personally viewed and independently interpreted the imaging studies ordered this visit. L foot: no acute bony abnormalities appreciated.  WBAT.  New Prescriptions   IBUPROFEN (ADVIL) 800 MG TABLET    Take 1 tablet (800 mg total) by mouth 3 (three) times daily with meals.    Orders Placed This Encounter  Procedures   DG Foot Complete Left    Work/school excuse note: provided. Recommend:  Follow-up Information     [facility]).   Specialty: Urgent Care Why: If worsening or failing to improve as anticipated. Contact information: [address] [place] [address] [phone]                 Reviewed expectations re: course of current medical issues. Questions answered. Outlined signs and symptoms indicating need for more acute intervention. Patient verbalized understanding. After Visit Summary given.  SUBJECTIVE: History from: patient. Jerry Diaz is a 22 y.o. male who reports possible injury to left foot two d ago; pt sts unsure of what happened due to large amount of ETOH; pt sts pain in left heel. Able to bear wt but is painful. No extremity sensation changes or weakness. No tx PTA.  Past Surgical History:  Procedure Laterality Date   ORCHIECTOMY Right 06/04/2018   Procedure: bilateral orciopexy,scrotal exploration;  Surgeon: Crista Elliot, MD;  Location: [facility];  Service: Urology;  Laterality: Right;    OBJECTIVE:  Vitals:   10/01/23 1548  BP: 121/72  Pulse: 91  Resp: 18  Temp: 99 F (37.2 C)  TempSrc: Oral  SpO2: 96%    General appearance: alert; no distress HEENT: [place]; AT Neck: supple with FROM Resp: unlabored respirations Extremities: R foot: warm with well perfused appearance; poorly localized moderate tenderness over left heel; without gross  deformities; swelling: none; bruising: none; ankle ROM: normal CV: brisk extremity capillary refill of LLE; 2+ DP pulse of LLE. Skin: warm and dry; no visible rashes Neurologic: normal sensation and strength of LLE Psychological: alert and cooperative; normal mood and affect  Imaging: No results found.    Allergies  Allergen Reactions   Aspirin Shortness Of Breath    Feels like his chest "closes up"    Corn-Containing Products    Egg-Derived Products    Peanut-Containing Drug Products     Past Medical History:  Diagnosis Date   Allergy    Asthma    Eczema    Seasonal allergies    Social History   Socioeconomic History   Marital status: Single    Spouse name: Not on file   Number of children: Not on file   Years of education: Not on file   Highest education level: Not on file  Occupational History   Not on file  Tobacco Use   Smoking status: Some Days   Smokeless tobacco: Never  Vaping Use   Vaping status: Never Used  Substance and Sexual Activity   Alcohol use: No   Drug use: No   Sexual activity: Not on file  Other Topics Concern   Not on file  Social History Narrative   Lives with mom and sister.   Spends time with Dad.   Social Drivers of Corporate investment banker Strain: Not on file  Food Insecurity: Not on file  Transportation Needs: Not on file  Physical Activity: Not on file  Stress: Not on file  Social Connections: Unknown (02/20/2022)   [facility]   Social Network    Social Network: Not on file   Family History  Problem Relation Age of Onset   Kidney disease Maternal Grandfather 23       kidney failure   Cancer Paternal Grandmother 48       b reast cancer   Heart disease Neg Hx    Stroke Neg Hx    Past Surgical History:  Procedure Laterality Date   ORCHIECTOMY Right 06/04/2018   Procedure: bilateral orciopexy,scrotal exploration;  Surgeon: Crista Elliot, MD;  Location: [facility];  Service: Urology;  Laterality:  Right;       Mardella Layman, MD 10/01/23 [phone]

## 2023-10-01 NOTE — ED Triage Notes (Signed)
Pt here for possible injury to left foot on Sunday night; pt sts unsure of what happened due to large amount of ETOH; pt sts pain in left heel

## 2024-07-08 ENCOUNTER — Ambulatory Visit: Admission: EM | Admit: 2024-07-08 | Discharge: 2024-07-08 | Disposition: A | Discharge status: Home / Self Care

## 2024-07-08 DIAGNOSIS — J4531 Mild persistent asthma with (acute) exacerbation: Secondary | ICD-10-CM | POA: Diagnosis not present

## 2024-07-08 DIAGNOSIS — R051 Acute cough: Secondary | ICD-10-CM

## 2024-07-08 DIAGNOSIS — J069 Acute upper respiratory infection, unspecified: Secondary | ICD-10-CM | POA: Diagnosis not present

## 2024-07-08 LAB — POC SOFIA SARS ANTIGEN FIA: SARS Coronavirus 2 Ag: NEGATIVE

## 2024-07-08 MED ORDER — ALBUTEROL SULFATE HFA 108 (90 BASE) MCG/ACT IN AERS: 1.0000 | INHALATION_SPRAY | Freq: Four times a day (QID) | RESPIRATORY_TRACT | 0 refills | Status: AC | PRN

## 2024-07-08 MED ORDER — AEROCHAMBER PLUS FLO-VU MEDIUM MISC
1.0000 | Freq: Once | Status: AC
  Administered 2024-07-08: 1

## 2024-07-08 MED ORDER — IPRATROPIUM-ALBUTEROL 0.5-2.5 (3) MG/3ML IN SOLN
3.0000 mL | Freq: Once | RESPIRATORY_TRACT | Status: AC
  Administered 2024-07-08: 3 mL via RESPIRATORY_TRACT

## 2024-07-08 MED ORDER — MUCINEX DM MAXIMUM STRENGTH 60-1200 MG PO TB12: 1.0000 | ORAL_TABLET | Freq: Two times a day (BID) | ORAL | 0 refills | Status: DC

## 2024-07-08 MED ORDER — PREDNISONE 20 MG PO TABS: 40.0000 mg | ORAL_TABLET | Freq: Every day | ORAL | 0 refills | Status: AC

## 2024-07-08 MED ORDER — BENZONATATE 200 MG PO CAPS: 200.0000 mg | ORAL_CAPSULE | Freq: Three times a day (TID) | ORAL | 0 refills | Status: DC | PRN

## 2024-07-08 NOTE — ED Provider Notes (Signed)
 [facility]    CSN: [id number] Arrival date & time: 07/08/24  1228      History   Chief Complaint Chief Complaint  Patient presents with   Asthma Exacerbation   Breathing Concerns   Letter for School/Work   SEXUALLY TRANSMITTED DISEASE    Testing   Dysuria    HPI Jerry Diaz is a 23 y.o. male.   Discussed the use of AI scribe software for clinical note transcription with the patient, who gave verbal consent to proceed.   Patient presents with worsening asthma symptoms, including wheezing, difficulty breathing, and a persistent dry cough that started two nights ago. The patient reports initially waking up congested in the middle of the night, attributing it to sleeping with a fan on. He turned off the fan and took Sudafed, which provided some relief. However, upon waking the next day, he experienced wheezing, trouble breathing, and developed a dry, repetitive cough.  The symptoms have persisted and worsened over the past two days. He reports that physical activity exacerbates his breathing difficulties. He attempted to prepare for work today but found that getting up and moving around made his breathing even more labored. The patient also experienced subjective fever, chills, and sweating during the night. He notes feeling generally unwell and describes his cough as wheezy, stating he feels very congested.  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 He reports using a mask when working on the floors. The patient has been managing his symptoms with over-the-counter medications, including Sudafed and Mucinex. He also used a nebulizer treatment yesterday. He reports that he has lost his albuterol  inhaler and hasn't been able to use it for symptom relief. Rest and lying down have provided some relief, while physical activity and movement have worsened his symptoms.  The patient denies current smoking or  vaping but admits to occasional use when drinking. He does not regularly use his prescribed Flonase  nasal spray.   The following sections of the patient's history were reviewed and updated as appropriate: allergies, current medications, past family history, past medical history, past social history, past surgical history, and problem list.       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 Authorizing Provider  benzonatate (TESSALON) 200 MG capsule Take 1 capsule (200 mg total) by mouth 3 (three) times daily as needed for cough. 07/08/24  Yes Aerianna Losey, Lucie, FNP  Dextromethorphan-guaiFENesin (MUCINEX DM MAXIMUM STRENGTH) 60-1200 MG TB12 Take 1 tablet by mouth 2 (two) times daily. 07/08/24  Yes Iola Lucie, FNP  diphenhydrAMINE  (BENADRYL ) 25 MG tablet Take 25 mg by mouth every 6 (six) hours as needed. 06/15/20  Yes [provider]  predniSONE  (DELTASONE ) 20 MG tablet Take 2 tablets (40 mg total) by mouth daily for 5 days. 07/08/24 07/13/24 Yes Iola Lucie,  FNP  albuterol  (VENTOLIN  HFA) 108 (90 Base) MCG/ACT inhaler Inhale 1-2 puffs into the lungs every 6 (six) hours as needed for wheezing or shortness of breath. 07/08/24   Iola Lukes, FNP  EPINEPHrine  (EPIPEN ) 0.3 mg/0.3 mL DEVI Inject 0.3 mLs (0.3 mg total) into the muscle once. 06/01/11   Rena Luke POUR, MD  fluticasone  (FLONASE ) 50 MCG/ACT nasal spray Place 2 sprays  into both nostrils daily. 02/02/21   Raspet, Erin K, PA-C  beclomethasone (QVAR ) 40 MCG/ACT inhaler Inhale 2 puffs into the lungs 2 (two) times daily. Patient not taking: Reported on 02/02/2021 06/10/13 02/02/21  Tharon Lenis, NP  cetirizine  (ZYRTEC  ALLERGY) 10 MG tablet Take 1 tablet (10 mg total) by mouth daily. Patient not taking: Reported on 02/02/2021 01/29/17 02/02/21  Ronette Neighbor, MD  fexofenadine  (ALLEGRA ) 180 MG tablet Take 1 tablet (180 mg total) by mouth daily. Patient not taking: Reported on 02/02/2021 06/10/13 02/02/21  Tharon Lenis, NP  fluticasone  (FLOVENT  HFA) 44 MCG/ACT inhaler Inhale 2 puffs into the lungs 2 (two) times daily. Patient not taking: Reported on 02/02/2021 01/29/17 02/02/21  Ronette Neighbor, MD    Family History Family History  Problem Relation Age of Onset   Kidney disease Maternal Grandfather 23       kidney failure   Cancer Paternal Grandmother 32       b reast cancer   Heart disease Neg Hx    Stroke Neg Hx     Social History Social History   Tobacco Use   Smoking status: Some Days    Types: Cigarettes   Smokeless tobacco: Never  Vaping Use   Vaping status: Never Used  Substance Use Topics   Alcohol use: Not Currently   Drug use: Never     Allergies   Aspirin, Corn-containing products, Egg-derived products, and Peanut-containing drug products   Review of Systems Review of Systems  Constitutional:  Positive for chills (last night), diaphoresis (last night) and fever (unsure, possibly due to experiencing chills and sweats last night).  HENT:  Positive for congestion. Negative for rhinorrhea and sore throat.   Respiratory:  Positive for cough, chest tightness, shortness of breath and wheezing.   Gastrointestinal:  Negative for diarrhea, nausea and vomiting.  Neurological:  Positive for headaches.  All other systems reviewed and are negative.    Physical Exam Triage Vital Signs ED Triage Vitals  Encounter Vitals Group     BP 07/08/24 1242  129/82     Girls Systolic BP Percentile --      Girls Diastolic BP Percentile --      Boys Systolic BP Percentile --      Boys Diastolic BP Percentile --      Pulse Rate 07/08/24 1230 (!) 111     Resp 07/08/24 1230 (!) 22     Temp 07/08/24 1242 98.5 F (36.9 C)     Temp Source 07/08/24 1242 Oral     SpO2 07/08/24 1230 97 %     Weight 07/08/24 1247 200 lb (90.7 kg)     Height 07/08/24 1247 6' 1 (1.854 m)     Head Circumference --      Peak Flow --      Pain Score 07/08/24 1246 0     Pain Loc --      Pain Education --      Exclude from Growth Chart --    No data found.  Updated Vital Signs BP 129/82 (BP Location: Right Arm)   Pulse ROLLEN)  106   Temp 98.5 F (36.9 C) (Oral)   Resp (!) 22   Ht 6' 1 (1.854 m)   Wt 200 lb (90.7 kg)   SpO2 96%   BMI 26.39 kg/m   Visual Acuity Right Eye Distance:   Left Eye Distance:   Bilateral Distance:    Right Eye Near:   Left Eye Near:    Bilateral Near:     Physical Exam Vitals reviewed.  Constitutional:      General: He is awake. He is not in acute distress.    Appearance: Normal appearance. He is well-developed. He is not ill-appearing, toxic-appearing or diaphoretic.  HENT:     Head: Normocephalic.     Right Ear: Tympanic membrane, ear canal and external ear normal. No drainage, swelling or tenderness. No middle ear effusion. Tympanic membrane is not erythematous.     Left Ear: Tympanic membrane, ear canal and external ear normal. No drainage, swelling or tenderness.  No middle ear effusion. Tympanic membrane is not erythematous.     Nose: Congestion present. No rhinorrhea.     Mouth/Throat:     Lips: Pink.     Mouth: Mucous membranes are moist.     Pharynx: No pharyngeal swelling, oropharyngeal exudate, posterior oropharyngeal erythema or uvula swelling.     Tonsils: No tonsillar exudate or tonsillar abscesses.  Eyes:     General: Vision grossly intact.     Conjunctiva/sclera: Conjunctivae normal.  Cardiovascular:      Rate and Rhythm: Normal rate.     Heart sounds: Normal heart sounds.  Pulmonary:     Effort: Pulmonary effort is normal. No tachypnea or respiratory distress.     Breath sounds: Normal air entry. Wheezing present.     Comments: Diffused expiratory and inspiratory wheezing noted throughout all lung fields. Respirations even and unlabored. No acute distress noted.  Musculoskeletal:        General: Normal range of motion.     Cervical back: Normal range of motion and neck supple.  Lymphadenopathy:     Cervical: No cervical adenopathy.  Skin:    General: Skin is warm and dry.  Neurological:     General: No focal deficit present.     Mental Status: He is alert and oriented to person, place, and time.  Psychiatric:        Behavior: Behavior is cooperative.      UC Treatments / Results  Labs (all labs ordered are listed, but only abnormal results are displayed) Labs Reviewed  POC SOFIA SARS ANTIGEN FIA - Normal    EKG   Radiology No results found.  Procedures Procedures (including critical care time)  Medications Ordered in UC Medications  ipratropium-albuterol  (DUONEB) 0.5-2.5 (3) MG/3ML nebulizer solution 3 mL (3 mLs Nebulization Given 07/08/24 1359)    Initial Impression / Assessment and Plan / UC Course  I have reviewed the triage vital signs and the nursing notes.  Pertinent labs & imaging results that were available during my care of the patient were reviewed by me and considered in my medical decision making (see chart for details).     The patient with history of asthma presents with symptoms consistent with a viral upper respiratory infection. COVID test negative. Diffused wheezing noted on exam but improved after DUONEB treatment with improvement in symptoms. Exam is reassuring and no evidence of bacterial infection or acute cardiopulmonary process is noted. Supportive care is recommended. Patient was advised to follow up with primary care if symptoms do  not  improve within one week or if new concerns arise. Instructions were given to seek emergency care if symptoms worsen, including shortness of breath, chest pain, persistent high fever, inability to tolerate fluids, or confusion.  Today's evaluation has revealed no signs of a dangerous process. Discussed diagnosis with patient and/or guardian. Patient and/or guardian aware of their diagnosis, possible red flag symptoms to watch out for and need for close follow up. Patient and/or guardian understands verbal and written discharge instructions. Patient and/or guardian comfortable with plan and disposition.  Patient and/or guardian has a clear mental status at this time, good insight into illness (after discussion and teaching) and has clear judgment to make decisions regarding their care  Documentation was completed with the aid of voice recognition software. Transcription may contain typographical errors.   Final Clinical Impressions(s) / UC Diagnoses   Final diagnoses:  Acute cough  Mild persistent asthma with acute exacerbation  Viral upper respiratory tract infection     Discharge Instructions      You were seen today for cough and congestion. Your symptoms are most consistent with a viral infection that triggered an asthma flare. Your COVID test was negative. Wheezing was noted on exam but improved after a DuoNeb breathing treatment. No signs of bacterial infection or other urgent problems were found. Take medications as prescribed. Drink plenty of fluids to stay hydrated, get rest, and consider using a humidifier at home. Use your rescue inhaler if coughing or wheezing increases. Acetaminophen  or ibuprofen  may be used for fever or discomfort.  See your primary care provider if symptoms do not improve in one week or if new issues develop. Go to the emergency room right away if you have severe shortness of breath, chest pain, a high fever that will not come down, trouble keeping fluids down,  confusion, or any sudden or worsening symptoms.     ED Prescriptions     Medication Sig Dispense Auth. Provider   albuterol  (VENTOLIN  HFA) 108 (90 Base) MCG/ACT inhaler Inhale 1-2 puffs into the lungs every 6 (six) hours as needed for wheezing or shortness of breath. 34 g Iola Lukes, FNP   predniSONE  (DELTASONE ) 20 MG tablet Take 2 tablets (40 mg total) by mouth daily for 5 days. 10 tablet Clorissa Gruenberg, [place], FNP   Dextromethorphan-guaiFENesin (MUCINEX DM MAXIMUM STRENGTH) 60-1200 MG TB12 Take 1 tablet by mouth 2 (two) times daily. 20 tablet Iola Lukes, FNP   benzonatate (TESSALON) 200 MG capsule Take 1 capsule (200 mg total) by mouth 3 (three) times daily as needed for cough. 30 capsule Iola Lukes, FNP      PDMP not reviewed this encounter.   Iola Lukes, [place] 07/08/24 1436

## 2024-07-08 NOTE — ED Notes (Signed)
 While checking in, per request of patient access.

## 2024-07-08 NOTE — ED Triage Notes (Signed)
 Patient also reports painful urination when peeing and beginning to pee. No discharge noted. Requests STI testing and any other.

## 2024-07-08 NOTE — ED Triage Notes (Signed)
 Patient reports onset of symptoms Monday night, initially experiencing cold symptoms including congestion and cough. The cough began as productive (wet) and transitioned to dry. Patient took Sudafed yesterday, but cough became more persistent. This morning, patient awoke with chest tightness and shortness of breath, exacerbated by asthma. Reports constant urge to cough.

## 2024-07-08 NOTE — Discharge Instructions (Addendum)
 You were seen today for cough and congestion. Your symptoms are most consistent with a viral infection that triggered an asthma flare. Your COVID test was negative. Wheezing was noted on exam but improved after a DuoNeb breathing treatment. No signs of bacterial infection or other urgent problems were found. Take medications as prescribed. Drink plenty of fluids to stay hydrated, get rest, and consider using a humidifier at home. Use your rescue inhaler if coughing or wheezing increases. Acetaminophen  or ibuprofen  may be used for fever or discomfort.  See your primary care provider if symptoms do not improve in one week or if new issues develop. Go to the emergency room right away if you have severe shortness of breath, chest pain, a high fever that will not come down, trouble keeping fluids down, confusion, or any sudden or worsening symptoms.

## 2024-08-05 ENCOUNTER — Ambulatory Visit
Admission: EM | Admit: 2024-08-05 | Discharge: 2024-08-05 | Disposition: A | Discharge status: Home / Self Care | Attending: Family Medicine | Admitting: Family Medicine

## 2024-08-05 DIAGNOSIS — R6889 Other general symptoms and signs: Secondary | ICD-10-CM | POA: Diagnosis not present

## 2024-08-05 DIAGNOSIS — J00 Acute nasopharyngitis [common cold]: Secondary | ICD-10-CM | POA: Diagnosis not present

## 2024-08-05 DIAGNOSIS — R051 Acute cough: Secondary | ICD-10-CM

## 2024-08-05 LAB — POC COVID19/FLU A&B COMBO
Covid Antigen, POC: NEGATIVE
Influenza A Antigen, POC: NEGATIVE
Influenza B Antigen, POC: NEGATIVE

## 2024-08-05 MED ORDER — HYDROCODONE BIT-HOMATROP MBR 5-1.5 MG/5ML PO SOLN: 5.0000 mL | Freq: Four times a day (QID) | ORAL | 0 refills | Status: AC | PRN

## 2024-08-05 NOTE — ED Triage Notes (Signed)
 Patient reports symptoms beginning Sundasy night with ha then sore throat by the next morning with exposure to clients at work with COVID19. No seasonal Flu or COVID19 vaccine yet. No fever. No rash. No runny nose. No cough.

## 2024-08-05 NOTE — Discharge Instructions (Signed)
 Be aware, your cough medication may cause drowsiness. Please do not drive, operate heavy machinery or make important decisions while on this medication, it can cloud your judgement.

## 2024-08-05 NOTE — ED Provider Notes (Signed)
 [facility]   [id number] 08/05/24 Arrival Time: 1226  ASSESSMENT & PLAN:  1. Common cold   2. Not feeling great   3. Acute cough    Discussed typical duration of likely viral illness. Results for orders placed or performed during the hospital encounter of 08/05/24  POC Covid19/Flu A&B Antigen   Collection Time: 08/05/24  1:52 PM  Result Value Ref Range   Influenza A Antigen, POC Negative Negative   Influenza B Antigen, POC Negative Negative   Covid Antigen, POC Negative Negative   OTC symptom care as needed.  Meds ordered this encounter  Medications   HYDROcodone  bit-homatropine (HYCODAN) 5-1.5 MG/5ML syrup    Sig: Take 5 mLs by mouth every 6 (six) hours as needed for cough.    Dispense:  60 mL    Refill:  0     Discharge Instructions      Be aware, your cough medication may cause drowsiness. Please do not drive, operate heavy machinery or make important decisions while on this medication, it can cloud your judgement.     Work note provided.  Follow-up Information     [facility]).   Specialty: Urgent Care Why: If worsening or failing to improve as anticipated. Contact information: [address] [place]  [address] [phone]                Reviewed expectations re: course of current medical issues. Questions answered. Outlined signs and symptoms indicating need for more acute intervention. Understanding verbalized. After Visit Summary given.   SUBJECTIVE: History from: Patient. Jerry Diaz is a 23 y.o. male. Patient reports symptoms beginning Sundasy night with ha then sore throat by the next morning with exposure to clients at work with COVID19. No seasonal Flu or COVID19 vaccine yet. No fever. No rash. No runny nose. No cough.  Denies: fever. Normal PO intake without n/v/d.  OBJECTIVE:  Vitals:   08/05/24 1244 08/05/24 1247  BP:  124/85  Pulse:  90   Resp:  20  Temp:  99.6 F (37.6 C)  TempSrc:  Oral  SpO2:  95%  Weight: 90.7 kg   Height: 6' 1 (1.854 m)     General appearance: alert; no distress Eyes: PERRLA; EOMI; conjunctiva normal HENT: [place]; AT; with nasal congestion Neck: supple  Lungs: speaks full sentences without difficulty; unlabored Extremities: no edema Skin: warm and dry Neurologic: normal gait Psychological: alert and cooperative; normal mood and affect  Labs: Results for orders placed or performed during the hospital encounter of 08/05/24  POC Covid19/Flu A&B Antigen   Collection Time: 08/05/24  1:52 PM  Result Value Ref Range   Influenza A Antigen, POC Negative Negative   Influenza B Antigen, POC Negative Negative   Covid Antigen, POC Negative Negative   Labs Reviewed  POC COVID19/FLU A&B COMBO - Normal    Imaging: No results found.  Allergies  Allergen Reactions   Aspirin Shortness Of Breath and Swelling    Feels like his chest closes up  Lip swelling   Corn-Containing Products    Egg Protein-Containing Drug Products    Peanut-Containing Drug Products     Past Medical History:  Diagnosis Date   Allergy    Asthma    Eczema    Seasonal allergies    Social History   Socioeconomic History   Marital status: Single    Spouse name: Not on file   Number of children: Not on  file   Years of education: Not on file   Highest education level: Not on file  Occupational History   Not on file  Tobacco Use   Smoking status: Some Days    Types: Cigarettes   Smokeless tobacco: Never  Vaping Use   Vaping status: Never Used  Substance and Sexual Activity   Alcohol use: Not Currently   Drug use: Never   Sexual activity: Yes    Birth control/protection: None  Other Topics Concern   Not on file  Social History Narrative   Lives with mom and sister.   Spends time with Dad.   Social Drivers of Corporate Investment Banker Strain: Not on file  Food Insecurity: Not on file  Transportation  Needs: Not on file  Physical Activity: Not on file  Stress: Not on file  Social Connections: Unknown (02/20/2022)   Received from [facility]   Social Network    Social Network: Not on file  Intimate Partner Violence: Unknown (01/12/2022)   Received from [facility]   HITS    Physically Hurt: Not on file    Insult or Talk Down To: Not on file    Threaten Physical Harm: Not on file    Scream or Curse: Not on file   Family History  Problem Relation Age of Onset   Kidney disease Maternal Grandfather 23       kidney failure   Cancer Paternal Grandmother 50       b reast cancer   Heart disease Neg Hx    Stroke Neg Hx    Past Surgical History:  Procedure Laterality Date   ORCHIECTOMY Right 06/04/2018   Procedure: bilateral orciopexy,scrotal exploration;  Surgeon: Carolee Sherwood JONETTA DOUGLAS, MD;  Location: [facility];  Service: Urology;  Laterality: Right;     Rolinda Rogue, MD 08/05/24 1430

## 2024-09-20 ENCOUNTER — Telehealth: Admitting: Family Medicine

## 2024-09-20 DIAGNOSIS — L309 Dermatitis, unspecified: Secondary | ICD-10-CM

## 2024-09-20 DIAGNOSIS — R112 Nausea with vomiting, unspecified: Secondary | ICD-10-CM

## 2024-09-20 MED ORDER — TRIAMCINOLONE ACETONIDE 0.1 % EX CREA: 1.0000 | TOPICAL_CREAM | Freq: Two times a day (BID) | CUTANEOUS | 0 refills | Status: DC

## 2024-09-20 MED ORDER — ONDANSETRON HCL 4 MG PO TABS: 4.0000 mg | ORAL_TABLET | Freq: Three times a day (TID) | ORAL | 0 refills | Status: DC | PRN

## 2024-09-20 NOTE — Progress Notes (Signed)

## 2024-09-20 NOTE — Progress Notes (Signed)

## 2024-09-21 ENCOUNTER — Encounter

## 2024-10-07 ENCOUNTER — Telehealth: Admitting: Nurse Practitioner

## 2024-10-07 DIAGNOSIS — J40 Bronchitis, not specified as acute or chronic: Secondary | ICD-10-CM | POA: Diagnosis not present

## 2024-10-07 MED ORDER — BENZONATATE 100 MG PO CAPS: 100.0000 mg | ORAL_CAPSULE | Freq: Three times a day (TID) | ORAL | 0 refills | Status: DC | PRN

## 2024-10-07 MED ORDER — PREDNISONE 20 MG PO TABS: 20.0000 mg | ORAL_TABLET | Freq: Two times a day (BID) | ORAL | 0 refills | Status: AC

## 2024-10-07 NOTE — Progress Notes (Signed)
 We are sorry that you are not feeling well.  Here is how we plan to help!  Based on your presentation I believe you most likely have A cough due to a virus.  This is called viral bronchitis and is best treated by rest, plenty of fluids and control of the cough.  You may use Ibuprofen  or Tylenol  as directed to help your symptoms.     In addition you may use A prescription cough medication called Tessalon  Perles 100mg . You may take 1-2 capsules every 8 hours as needed for your cough.  We have also prescribed prednisone  twice daily for 5 days (take with food)   From your responses in the eVisit questionnaire you describe inflammation in the upper respiratory tract which is causing a significant cough.  This is commonly called Bronchitis and has four common causes:   Allergies Viral Infections Acid Reflux Bacterial Infection Allergies, viruses and acid reflux are treated by controlling symptoms or eliminating the cause. An example might be a cough caused by taking certain blood pressure medications. You stop the cough by changing the medication. Another example might be a cough caused by acid reflux. Controlling the reflux helps control the cough.  USE OF BRONCHODILATOR (RESCUE) INHALERS: There is a risk from using your bronchodilator too frequently.  The risk is that over-reliance on a medication which only relaxes the muscles surrounding the breathing tubes can reduce the effectiveness of medications prescribed to reduce swelling and congestion of the tubes themselves.  Although you feel brief relief from the bronchodilator inhaler, your asthma may actually be worsening with the tubes becoming more swollen and filled with mucus.  This can delay other crucial treatments, such as oral steroid medications. If you need to use a bronchodilator inhaler daily, several times per day, you should discuss this with your provider.  There are probably better treatments that could be used to keep your asthma under  control.     HOME CARE Only take medications as instructed by your medical team. Complete the entire course of an antibiotic. Drink plenty of fluids and get plenty of rest. Avoid close contacts especially the very young and the elderly Cover your mouth if you cough or cough into your sleeve. Always remember to wash your hands A steam or ultrasonic humidifier can help congestion.   GET HELP RIGHT AWAY IF: You develop worsening fever. You become short of breath You cough up blood. Your symptoms persist after you have completed your treatment plan MAKE SURE YOU  Understand these instructions. Will watch your condition. Will get help right away if you are not doing well or get worse.  Your e-visit answers were reviewed by a board certified advanced clinical practitioner to complete your personal care plan.  Depending on the condition, your plan could have included both over the counter or prescription medications. If there is a problem please reply  once you have received a response from your provider. Your safety is important to us .  If you have drug allergies check your prescription carefully.    You can use MyChart to ask questions about todays visit, request a non-urgent call back, or ask for a work or school excuse for 24 hours related to this e-Visit. If it has been greater than 24 hours you will need to follow up with your provider, or enter a new e-Visit to address those concerns. You will get an e-mail in the next two days asking about your experience.  I hope that your e-visit  has been valuable and will speed your recovery. Thank you for using e-visits.   I have spent 5 minutes in review of e-visit questionnaire, review and updating patient chart, medical decision making and response to patient.   Lauraine Kitty, FNP

## 2024-10-09 ENCOUNTER — Telehealth: Admitting: Physician Assistant

## 2024-10-09 DIAGNOSIS — J4521 Mild intermittent asthma with (acute) exacerbation: Secondary | ICD-10-CM

## 2024-10-09 MED ORDER — FLUTICASONE-SALMETEROL 100-50 MCG/ACT IN AEPB: 1.0000 | INHALATION_SPRAY | Freq: Two times a day (BID) | RESPIRATORY_TRACT | 3 refills | Status: AC

## 2024-10-09 NOTE — Progress Notes (Signed)
 We are sorry that you are not feeling well.  Here is how we plan to help!  Based on your presentation I believe you most likely have A cough due to a virus.  This is called viral bronchitis and is best treated by rest, plenty of fluids and control of the cough.  You may use Ibuprofen  or Tylenol  as directed to help your symptoms.     In addition you may use A non-prescription cough medication called Mucinex  DM: take 2 tablets every 12 hours.  Continue medications previously prescribed.  I am prescribing: Advair 100-50 mcg Use 1 puff twice daily for 7-10 days.  From your responses in the eVisit questionnaire you describe inflammation in the upper respiratory tract which is causing a significant cough.  This is commonly called Bronchitis and has four common causes:   Allergies Viral Infections Acid Reflux Bacterial Infection Allergies, viruses and acid reflux are treated by controlling symptoms or eliminating the cause. An example might be a cough caused by taking certain blood pressure medications. You stop the cough by changing the medication. Another example might be a cough caused by acid reflux. Controlling the reflux helps control the cough.  USE OF BRONCHODILATOR (RESCUE) INHALERS: There is a risk from using your bronchodilator too frequently.  The risk is that over-reliance on a medication which only relaxes the muscles surrounding the breathing tubes can reduce the effectiveness of medications prescribed to reduce swelling and congestion of the tubes themselves.  Although you feel brief relief from the bronchodilator inhaler, your asthma may actually be worsening with the tubes becoming more swollen and filled with mucus.  This can delay other crucial treatments, such as oral steroid medications. If you need to use a bronchodilator inhaler daily, several times per day, you should discuss this with your provider.  There are probably better treatments that could be used to keep your asthma  under control.     HOME CARE Only take medications as instructed by your medical team. Complete the entire course of an antibiotic. Drink plenty of fluids and get plenty of rest. Avoid close contacts especially the very young and the elderly Cover your mouth if you cough or cough into your sleeve. Always remember to wash your hands A steam or ultrasonic humidifier can help congestion.   GET HELP RIGHT AWAY IF: You develop worsening fever. You become short of breath You cough up blood. Your symptoms persist after you have completed your treatment plan MAKE SURE YOU  Understand these instructions. Will watch your condition. Will get help right away if you are not doing well or get worse.  Your e-visit answers were reviewed by a board certified advanced clinical practitioner to complete your personal care plan.  Depending on the condition, your plan could have included both over the counter or prescription medications. If there is a problem please reply  once you have received a response from your provider. Your safety is important to us .  If you have drug allergies check your prescription carefully.    You can use MyChart to ask questions about todays visit, request a non-urgent call back, or ask for a work or school excuse for 24 hours related to this e-Visit. If it has been greater than 24 hours you will need to follow up with your provider, or enter a new e-Visit to address those concerns. You will get an e-mail in the next two days asking about your experience.  I hope that your e-visit has been valuable  and will speed your recovery. Thank you for using e-visits.   I have spent 5 minutes in review of e-visit questionnaire, review and updating patient chart, medical decision making and response to patient.   Delon CHRISTELLA Dickinson, PA-C

## 2024-10-14 ENCOUNTER — Telehealth: Admitting: Emergency Medicine

## 2024-10-14 DIAGNOSIS — L309 Dermatitis, unspecified: Secondary | ICD-10-CM

## 2024-10-14 MED ORDER — TRIAMCINOLONE ACETONIDE 0.1 % EX CREA: 1.0000 | TOPICAL_CREAM | Freq: Two times a day (BID) | CUTANEOUS | 0 refills | Status: AC

## 2024-10-14 NOTE — Progress Notes (Signed)
 " E-Visit for Eczema  We are sorry that you are not feeling well. Here is how we plan to help! Based on what you shared with me it looks like you have eczema (atopic dermatitis).  Although the cause of eczema is not completely understood, genetics appear to play a strong role, and people with a family history of eczema are at increased risk of developing the condition. In most people with eczema, there is a genetic abnormality in the outermost layer of the skin, called the epidermis   Most people with eczema develop their first symptoms as children, before the age of 24. Intense itching of the skin, patches of redness, small bumps, and skin flaking are common. Scratching can further inflame the skin and worsen the itching. The itchiness may be more noticeable at nighttime.  Eczema commonly affects the back of the neck, the elbow creases, and the backs of the knees. Other affected areas may include the face, wrists, and forearms. The skin may become thickened and darkened, or even scarred, from repeated scratching. Eliminating factors that aggravate your eczema symptoms can help to control the symptoms. Possible triggers may include: ? Cold or dry environments ? Sweating ? Emotional stress or anxiety ? Rapid temperature changes ? Exposure to certain chemicals or cleaning solutions, including soaps and detergents, perfumes and cosmetics, wool or synthetic fibers, dust, sand, and cigarette smoke Keeping your skin hydrated Emollients -- Emollients are creams and ointments that moisturize the skin and prevent it from drying out. The best emollients for people with eczema are thick creams (such as Eucerin, Cetaphil, and Nutraderm) or ointments (such as petroleum jelly, Aquaphor, and Vaseline), which contain little to no water. Emollients are most effective when applied immediately after bathing. Emollients can be applied twice a day or more often if needed. Lotions contain more water than creams and  ointments and are less effective for moisturizing the skin. Bathing -- It is not clear if showers or baths are better for keeping the skin hydrated. Lukewarm baths or showers can hydrate and cool the skin, temporarily relieving itching from eczema. An unscented, mild soap or non-soap cleanser (such as Cetaphil) should be used sparingly. Apply an emollient immediately after bathing or showering to prevent your skin from drying out as a result of water evaporation. Emollient bath additives (products you add to the bath water) have not been found to help relieve symptoms. Hot or long baths (more than 10 to 15 minutes) and showers should be avoided since they can dry out the skin.  Based on what you shared with me you may have eczema.   Triamcinalone ointment (or cream). Apply to the effected areas twice per day.  I recommend dilute bleach baths for people with eczema. These baths help to decrease the number of bacteria on the skin that can cause infections or worsen symptoms. To prepare a bleach bath, one-fourth to one-half cup of bleach is placed in a full bathtub (about 40 gallons) of water. Bleach baths are usually taken for 5 to 10 minutes twice per week and should be followed by application of an emollient (listed above). I recommend you take Benadryl  25mg  - 50mg  every 4 hours to control the symptoms (including itching) but if they last over 24 hours it is best that you see an office based provider for follow up.  HOME CARE: Take lukewarm showers or baths Apply creams and ointments to prevent the skin from drying (Eucerin, Cetaphil, Nutraderm, petroleum jelly, Aquaphor or Vaseline) - these  these products contain less water than other lotions and are more effective for moisturizing the skin Limit exposure to cold or dry environments, sweating, emotional stress and anxiety, rapid temperature changes and exposure to chemicals and cleaning products, soaps and detergents, perfumes, cosmetics, wool and  synthetic fibers, dust, sand and cigarette- factors which can aggravate eczema symptoms.  Use a hydrocortisone cream once or twice a day Take an antihistamine like Benadryl  for widespread rashes that itch.  The adult dosage of Benadryl  is 25-50 mg by mouth 4 times daily. Caution: This type of medication may cause sleepiness.  Do not drink alcohol, drive, or operate dangerous machinery while taking antihistamines.  Do not take these medications if you have prostate enlargement.  Read the package instructions thoroughly on all medications that you take.  GET HELP RIGHT AWAY IF: Symptoms that don't go away after treatment. Severe itching that persists. You develop a fever. Your skin begins to drain. You have a sore throat. You become short of breath.  MAKE SURE YOU   Understand these instructions. Will watch your condition. Will get help right away if you are not doing well or get worse.    Thank you for choosing an e-visit.  Your e-visit answers were reviewed by a board certified advanced clinical practitioner to complete your personal care plan. Depending upon the condition, your plan could have included both over the counter or prescription medications.  Please review your pharmacy choice. Make sure the pharmacy is open so you can pick up prescription now. If there is a problem, you may contact your provider through [employer] and have the prescription routed to another pharmacy.  Your safety is important to us . If you have drug allergies check your prescription carefully.   For the next 24 hours you can use MyChart to ask questions about today's visit, request a non-urgent call back, or ask for a work or school excuse. You will get an email in the next two days asking about your experience. I hope that your e-visit has been valuable and will speed your recovery.  I have spent 5 minutes in review of e-visit questionnaire, review and updating patient chart, medical decision making  and response to patient.   Jon Belt, PhD, FNP-BC       "

## 2024-10-17 ENCOUNTER — Telehealth: Admitting: Family Medicine

## 2024-10-17 DIAGNOSIS — J40 Bronchitis, not specified as acute or chronic: Secondary | ICD-10-CM

## 2024-10-17 DIAGNOSIS — J069 Acute upper respiratory infection, unspecified: Secondary | ICD-10-CM

## 2024-10-17 MED ORDER — BENZONATATE 200 MG PO CAPS: 200.0000 mg | ORAL_CAPSULE | Freq: Two times a day (BID) | ORAL | 0 refills | Status: DC | PRN

## 2024-10-17 MED ORDER — AZITHROMYCIN 250 MG PO TABS: ORAL_TABLET | ORAL | 0 refills | Status: AC

## 2024-10-17 NOTE — Progress Notes (Signed)
 We are sorry that you are not feeling well.  Here is how we plan to help!  Based on your presentation I believe you most likely have A cough due to bacteria.  When patients have a fever and a productive cough with a change in color or increased sputum production, we are concerned about bacterial bronchitis.  If left untreated it can progress to pneumonia.  If your symptoms do not improve with your treatment plan it is important that you contact your provider.   I have prescribed Azithromyin 250 mg: two tablets now and then one tablet daily for 4 additonal days    In addition you may use A prescription cough medication called Tessalon  Perles 100mg . You may take 1-2 capsules every 8 hours as needed for your cough.  From your responses in the eVisit questionnaire you describe inflammation in the upper respiratory tract which is causing a significant cough.  This is commonly called Bronchitis and has four common causes:   Allergies Viral Infections Acid Reflux Bacterial Infection Allergies, viruses and acid reflux are treated by controlling symptoms or eliminating the cause. An example might be a cough caused by taking certain blood pressure medications. You stop the cough by changing the medication. Another example might be a cough caused by acid reflux. Controlling the reflux helps control the cough.  USE OF BRONCHODILATOR (RESCUE) INHALERS: There is a risk from using your bronchodilator too frequently.  The risk is that over-reliance on a medication which only relaxes the muscles surrounding the breathing tubes can reduce the effectiveness of medications prescribed to reduce swelling and congestion of the tubes themselves.  Although you feel brief relief from the bronchodilator inhaler, your asthma may actually be worsening with the tubes becoming more swollen and filled with mucus.  This can delay other crucial treatments, such as oral steroid medications. If you need to use a bronchodilator inhaler  daily, several times per day, you should discuss this with your provider.  There are probably better treatments that could be used to keep your asthma under control.     HOME CARE Only take medications as instructed by your medical team. Complete the entire course of an antibiotic. Drink plenty of fluids and get plenty of rest. Avoid close contacts especially the very young and the elderly Cover your mouth if you cough or cough into your sleeve. Always remember to wash your hands A steam or ultrasonic humidifier can help congestion.   GET HELP RIGHT AWAY IF: You develop worsening fever. You become short of breath You cough up blood. Your symptoms persist after you have completed your treatment plan MAKE SURE YOU  Understand these instructions. Will watch your condition. Will get help right away if you are not doing well or get worse.  Your e-visit answers were reviewed by a board certified advanced clinical practitioner to complete your personal care plan.  Depending on the condition, your plan could have included both over the counter or prescription medications. If there is a problem please reply  once you have received a response from your provider. Your safety is important to us .  If you have drug allergies check your prescription carefully.    You can use MyChart to ask questions about today's visit, request a non-urgent call back, or ask for a work or school excuse for 24 hours related to this e-Visit. If it has been greater than 24 hours you will need to follow up with your provider, or enter a new e-Visit to  address those concerns. You will get an e-mail in the next two days asking about your experience.  I hope that your e-visit has been valuable and will speed your recovery. Thank you for using e-visits.   I have spent 5 minutes in review of e-visit questionnaire, review and updating patient chart, medical decision making and response to patient.   Mart Colpitts, FNP

## 2024-10-27 ENCOUNTER — Telehealth

## 2024-10-27 DIAGNOSIS — J069 Acute upper respiratory infection, unspecified: Secondary | ICD-10-CM

## 2024-10-28 ENCOUNTER — Telehealth: Admitting: Family Medicine

## 2024-10-28 DIAGNOSIS — R110 Nausea: Secondary | ICD-10-CM

## 2024-10-28 MED ORDER — ONDANSETRON 4 MG PO TBDP: 4.0000 mg | ORAL_TABLET | Freq: Three times a day (TID) | ORAL | 0 refills | Status: AC | PRN

## 2024-10-28 NOTE — Progress Notes (Signed)
" °  Because of multiple e-visits for respiratory symptoms in the past few weeks, I feel your condition warrants further evaluation and I recommend that you be seen in a face-to-face visit.   NOTE: There will be NO CHARGE for this E-Visit   If you are having a true medical emergency, please call 911.     For an urgent face to face visit, [place] has multiple urgent care centers for your convenience.  Click the link below for the full list of locations and hours, walk-in wait times, appointment scheduling options and driving directions:  Urgent Care - [place], [place], [place], [place], [place], [place]  [place]     Your MyChart E-visit questionnaire answers were reviewed by a board certified advanced clinical practitioner to complete your personal care plan based on your specific symptoms.    Thank you for using e-Visits.    "

## 2024-10-28 NOTE — Progress Notes (Signed)

## 2024-10-30 ENCOUNTER — Telehealth: Admitting: Family Medicine

## 2024-10-30 DIAGNOSIS — A084 Viral intestinal infection, unspecified: Secondary | ICD-10-CM

## 2024-10-30 NOTE — Progress Notes (Signed)
 We are sorry that you are not feeling well. Here is how we plan to help!  See messages with patient. He is improving.   Based on what you have shared with me it looks like you have a Virus that is irritating your GI tract.  Vomiting is the forceful emptying of a portion of the stomach's content through the mouth.  Although nausea and vomiting can make you feel miserable, it's important to remember that these are not diseases, but rather symptoms of an underlying illness.  When we treat short term symptoms, we always caution that any symptoms that persist should be fully evaluated in a medical office.  I have prescribed a medication that will help alleviate your symptoms and allow you to stay hydrated:  HOME CARE: Drink clear liquids.  This is very important! Dehydration (the lack of fluid) can lead to a serious complication.  Start off with 1 tablespoon every 5 minutes for 8 hours. You may begin eating bland foods after 8 hours without vomiting.  Start with saltine crackers, white bread, rice, mashed potatoes, applesauce. After 48 hours on a bland diet, you may resume a normal diet. Try to go to sleep.  Sleep often empties the stomach and relieves the need to vomit.  GET HELP RIGHT AWAY IF:  Your symptoms do not improve or worsen within 2 days after treatment. You have a fever for over 3 days. You cannot keep down fluids after trying the medication.  MAKE SURE YOU:  Understand these instructions. Will watch your condition. Will get help right away if you are not doing well or get worse.   Thank you for choosing an e-visit. Your e-visit answers were reviewed by a board certified advanced clinical practitioner to complete your personal care plan. Depending upon the condition, your plan could have included both over the counter or prescription medications. Please review your pharmacy choice. Be sure that the pharmacy you have chosen is open so that you can pick up your prescription now.  If  there is a problem you may message your provider in MyChart to have the prescription routed to another pharmacy. Your safety is important to us . If you have drug allergies check your prescription carefully.  For the next 24 hours, you can use MyChart to ask questions about today's visit, request a non-urgent call back, or ask for a work or school excuse from your e-visit provider. You will get an e-mail in the next two days asking about your experience. I hope that your e-visit has been valuable and will speed your recovery.  I have spent 5 minutes in review of e-visit questionnaire, review and updating patient chart, medical decision making and response to patient.   Maribelle Hopple, FNP

## 2024-11-01 ENCOUNTER — Telehealth: Admitting: Family

## 2024-11-01 DIAGNOSIS — R6889 Other general symptoms and signs: Secondary | ICD-10-CM

## 2024-11-01 DIAGNOSIS — J029 Acute pharyngitis, unspecified: Secondary | ICD-10-CM

## 2024-11-01 MED ORDER — FLUTICASONE PROPIONATE 50 MCG/ACT NA SUSP: 2.0000 | Freq: Every day | NASAL | 6 refills | Status: AC

## 2024-11-01 MED ORDER — BENZONATATE 100 MG PO CAPS: 100.0000 mg | ORAL_CAPSULE | Freq: Two times a day (BID) | ORAL | 0 refills | Status: AC | PRN

## 2024-11-01 NOTE — Progress Notes (Signed)
 E visit for Flu like symptoms   We are sorry that you are not feeling well.  Here is how we plan to help! Based on what you have shared with me it looks like you may have a respiratory virus that may be influenza.  Influenza or the flu is  an infection caused by a respiratory virus. The flu virus is highly contagious and persons who did not receive their yearly flu vaccination may catch the flu from close contact.  We have anti-viral medications to treat the viruses that cause this infection. They are not a cure and only shorten the course of the infection. These prescriptions are most effective when they are given within the first 2 days of flu symptoms. Antiviral medications are indicated if you have a high risk of complications from the flu. You should  also consider an antiviral medication if you are in close contact with someone who is at risk. These medications can help patients avoid complications from the flu but have side effects that you should know.   Possible side effects from Tamiflu or oseltamivir include nausea, vomiting, diarrhea, dizziness, headaches, eye redness, sleep problems or other respiratory symptoms. You should not take Tamiflu if you have an allergy to oseltamivir or any to the ingredients in Tamiflu.   For nasal congestion, you may use an oral decongestant such as Mucinex  D or if you have glaucoma or high blood pressure use plain Mucinex .  Saline nasal spray or nasal drops can help and can safely be used as often as needed for congestion.  If you have a sore or scratchy throat, use a saltwater gargle-  to  teaspoon of salt dissolved in a 4-ounce to 8-ounce glass of warm water.  Gargle the solution for approximately 15-30 seconds and then spit.  It is important not to swallow the solution.  You can also use throat lozenges/cough drops and Chloraseptic spray to help with throat pain or discomfort.  Warm or cold liquids can also be helpful in relieving throat  pain.  For headache, pain or general discomfort, you can use Ibuprofen  or Tylenol  as directed.   Some authorities believe that zinc sprays or the use of Echinacea may shorten the course of your symptoms.  I have prescribed the following medications to help lessen symptoms: I have prescribed Tessalon  Perles 100 mg. You may take 1-2 capsules every 8 hours as needed for cough, and I have prescribed Fluticasone  nasal spray 2 sprays in each nostril one time per dayasal spray 2 sprays in each nostril one time per day  You are to isolate at home until you have been fever-free for at least 24 hours without a fever-reducing medication, and symptoms have been steadily improving for 24 hours.  If you must be around other household members who do not have symptoms, you need to make sure that both you and the family members are masking consistently with a high-quality mask.  If you note any worsening of symptoms despite treatment, please seek an in-person evaluation ASAP. If you note any significant shortness of breath or any chest pain, please seek ED evaluation. Please do not delay care!  ANYONE WHO HAS FLU SYMPTOMS SHOULD: Stay home. The flu is highly contagious and going out or to work exposes others! Be sure to drink plenty of fluids. Water is fine as well as fruit juices, sodas and electrolyte beverages. You may want to stay away from caffeine or alcohol. If you are nauseated, try taking small sips of  liquids. How do you know if you are getting enough fluid? Your urine should be a pale yellow or almost colorless. Get rest. Taking a steamy shower or using a humidifier may help nasal congestion and ease sore throat pain. Using a saline nasal spray works much the same way. Cough drops, hard candies and sore throat lozenges may ease your cough. Line up a caregiver. Have someone check on you regularly.  GET HELP RIGHT AWAY IF: You cannot keep down liquids or your medications. You become short of  breath Your fell like you are going to pass out or loose consciousness. Your symptoms persist after you have completed your treatment plan  MAKE SURE YOU  Understand these instructions. Will watch your condition. Will get help right away if you are not doing well or get worse.  Your e-visit answers were reviewed by a board certified advanced clinical practitioner to complete your personal care plan.  Depending on the condition, your plan could have included both over the counter or prescription medications.  If there is a problem please reply  once you have received a response from your provider.  Your safety is important to us .  If you have drug allergies check your prescription carefully.    You can use MyChart to ask questions about todays visit, request a non-urgent call back, or ask for a work or school excuse for 24 hours related to this e-Visit. If it has been greater than 24 hours you will need to follow up with your provider, or enter a new e-Visit to address those concerns.  You will get an e-mail in the next two days asking about your experience.  I hope that your e-visit has been valuable and will speed your recovery. Thank you for using e-visits.   I have spent 5 minutes in review of e-visit questionnaire, review and updating patient chart, medical decision making and response to patient.   Jerry Learn, FNP

## 2024-11-09 ENCOUNTER — Telehealth: Admitting: Nurse Practitioner

## 2024-11-09 DIAGNOSIS — J4521 Mild intermittent asthma with (acute) exacerbation: Secondary | ICD-10-CM
